# Patient Record
Sex: Female | Born: 1990 | Race: White | Hispanic: No | Marital: Married
Health system: Midwestern US, Community
[De-identification: ages and names within clinical notes are randomized; demographics above are authoritative.]

## PROBLEM LIST (undated history)

## (undated) DIAGNOSIS — R569 Unspecified convulsions: Secondary | ICD-10-CM

## (undated) DIAGNOSIS — R87619 Unspecified abnormal cytological findings in specimens from cervix uteri: Secondary | ICD-10-CM

## (undated) HISTORY — DX: Unspecified abnormal cytological findings in specimens from cervix uteri: R87.619

## (undated) HISTORY — PX: NO PAST SURGERIES: SHX2092

---

## 2014-07-03 NOTE — ED Provider Notes (Signed)
PATIENT:          Rhonda Hobbs, Rhonda Hobbs    DOS:           07/03/2014  MR #:             [id number]             ACCOUNT #:     [id number]  DATE OF BIRTH:    03-Mar-1991              AGE:           23      PROBLEM LIST:     R ankle pain: Entered Date: 03-Jul-2014 20:42, Entered By:  Carin Hock,  INTERFACES    HISTORY OF PRESENT ILLNESS:    PERTINENT HISTORY OF PRESENT  ILLNESS. 23 year old female stepped  off of a  ladder and accidentally rolled her right ankle.  Unable to walk on it  since  secondary to pain.      PHYSICAL EXAM Vital signs are stable she is in no acute distress.  Pain,  swelling, tenderness to the right lateral malleolus.  No deformities.  Joints  stable.  Nontender medial malleolus, fibular head.  She does have  tenderness  and pain at the base of the fifth metatarsal.  Neurovascularly intact  distally.   Limited range of motion of the ankle secondary to pain.    MEDICAL DECISION MAKING:    SIGNIFICANT FINDINGS/ED COURSE/MEDICAL DECISION MAKING/TREATMENT  PLAN X-rays  of the right ankle and foot show no acute fractures or acute bony  abnormalities.  She was placed in an Aircast, she has her own  crutches, she was  given naproxen, ice pack, and a prescription for naproxen and advised  to  followup in one to 2 weeks if she is not improving.  She was also  advised to  limit weightbearing for the first week as tolerated/able.      DIAGNOSIS Acute right ankle sprain    COPIES SENT TO::     GSELLMAN, ROBERT(PCP): [id number]    Electronic Signatures:  Levonne Hubert A (MD)  (Signed 03-Jul-2014 22:22)   Authored: PROBLEM LIST, HISTORY OF PRESENT ILLNESS, PHYSICAL EXAM,  MEDICAL  DECISION MAKING, DIAGNOSIS, Copies to be sent to:      Last Updated: 03-Jul-2014 22:22 by Tawni Pummel (MD)            Please see T-Sheet, initial assessment, and physician orders for  further details.    Dictating Physician: Tawni Pummel, MD  Original Electronic Signature Date: 07/03/2014 10:22 P  BAB  Document #:  [id number]    cc:  Era Skeen, MD       [phone]       [place] [address]

## 2014-07-04 ENCOUNTER — Inpatient Hospital Stay
Admit: 2014-07-04 | Discharge: 2014-07-04 | Disposition: A | Discharge status: Home / Self Care | Attending: Emergency Medicine

## 2015-12-09 NOTE — L&D Delivery Note (Signed)
Delivery Note At  a viable girl was delivered via  (Presentation:vertex ; LOA).  APGAR: 9, 9 ; weight pending .   Placenta status: intact, delivered with gentle traction .  Cord:  3 vessel with the following complications: none .  Cord pH: not colelcted  Anesthesia:  none Episiotomy:  none Lacerations:  1st degree right labial Est. Blood Loss (mL):  350  Mom to postpartum.  Baby to Couplet care / Skin to Skin.  Judith Fisher 11/08/2016, 9:42 PM

## 2016-06-25 ENCOUNTER — Ambulatory Visit (INDEPENDENT_AMBULATORY_CARE_PROVIDER_SITE_OTHER): Payer: Medicaid Other | Admitting: Obstetrics & Gynecology

## 2016-06-25 ENCOUNTER — Encounter: Payer: Self-pay | Admitting: Obstetrics & Gynecology

## 2016-06-25 VITALS — BP 95/63 | HR 86 | Temp 99.1°F | Wt 137.8 lb

## 2016-06-25 DIAGNOSIS — Z3492 Encounter for supervision of normal pregnancy, unspecified, second trimester: Secondary | ICD-10-CM | POA: Diagnosis not present

## 2016-06-25 DIAGNOSIS — O09899 Supervision of other high risk pregnancies, unspecified trimester: Secondary | ICD-10-CM

## 2016-06-25 DIAGNOSIS — Z302 Encounter for sterilization: Secondary | ICD-10-CM | POA: Insufficient documentation

## 2016-06-25 DIAGNOSIS — Z3490 Encounter for supervision of normal pregnancy, unspecified, unspecified trimester: Secondary | ICD-10-CM | POA: Insufficient documentation

## 2016-06-25 DIAGNOSIS — Z1389 Encounter for screening for other disorder: Secondary | ICD-10-CM

## 2016-06-25 DIAGNOSIS — Z331 Pregnant state, incidental: Secondary | ICD-10-CM

## 2016-06-25 LAB — POCT URINALYSIS DIPSTICK
Bilirubin, UA: NEGATIVE
Blood, UA: NEGATIVE
Ketones, UA: NEGATIVE
Nitrite, UA: NEGATIVE
Protein, UA: NEGATIVE
UROBILINOGEN UA: 0.2
pH, UA: 8

## 2016-06-25 NOTE — Patient Instructions (Signed)
Thank you for enrolling in MyChart. Please follow the instructions below to securely access your online medical record. MyChart allows you to send messages to your doctor, view your test results, renew your prescriptions, schedule appointments, and more.  How Do I Sign Up? 1. In your Internet browser, go to [email or web address] 2. Click on the New  User? link in the Sign In box.  3. Enter your MyChart Access Code exactly as it appears below. You will not need to use this code after you have completed the sign-up process. If you do not sign up before the expiration date, you must request a new code. MyChart Access Code: [email or web address] Expires: 08/16/2016  2:15 PM  4. Enter the last four digits of your Social Security Number (xxxx) and Date of Birth (mm/dd/yyyy) as indicated and click Next. You will be taken to the next sign-up page. 5. Create a MyChart ID. This will be your MyChart login ID and cannot be changed, so think of one that is secure and easy to remember. 6. Create a MyChart password. You can change your password at any time. 7. Enter your Password Reset Question and Answer and click Next. This can be used at a later time if you forget your password.  8. Select your communication preference, and if applicable enter your e-mail address. You will receive e-mail notification when new information is available in MyChart by choosing to receive e-mail notifications and filling in your e-mail. 9. Click Sign In. You can now view your medical record.   Additional Information If you have questions, you can email [email or web address] or call [phone] to talk to our MyChart staff. Remember, MyChart is NOT to be used for urgent needs. For medical emergencies, dial 911.  Second Trimester of Pregnancy The second trimester is from week 13 through week 28, months 4 through 6. The second trimester is often a time when you feel your best. Your body has also adjusted to  being pregnant, and you begin to feel better physically. Usually, morning sickness has lessened or quit completely, you may have more energy, and you may have an increase in appetite. The second trimester is also a time when the fetus is growing rapidly. At the end of the sixth month, the fetus is about 9 inches long and weighs about 1 pounds. You will likely begin to feel the baby move (quickening) between 18 and 20 weeks of the pregnancy. BODY CHANGES Your body goes through many changes during pregnancy. The changes vary from woman to woman.   Your weight will continue to increase. You will notice your lower abdomen bulging out.  You may begin to get stretch marks on your hips, abdomen, and breasts.  You may develop headaches that can be relieved by medicines approved by your health care provider.  You may urinate more often because the fetus is pressing on your bladder.  You may develop or continue to have heartburn as a result of your pregnancy.  You may develop constipation because certain hormones are causing the muscles that push waste through your intestines to slow down.  You may develop hemorrhoids or swollen, bulging veins (varicose veins).  You may have back pain because of the weight gain and pregnancy hormones relaxing your joints between the bones in your pelvis and as a result of a shift in weight and the muscles that support your balance.  Your breasts will continue to grow and be tender.  Your gums may bleed and  may be sensitive to brushing and flossing.  Dark spots or blotches (chloasma, mask of pregnancy) may develop on your face. This will likely fade after the baby is born.  A dark line from your belly button to the pubic area (linea nigra) may appear. This will likely fade after the baby is born.  You may have changes in your hair. These can include thickening of your hair, rapid growth, and changes in texture. Some women also have hair loss during or after  pregnancy, or hair that feels dry or thin. Your hair will most likely return to normal after your baby is born. WHAT TO EXPECT AT YOUR PRENATAL VISITS During a routine prenatal visit:  You will be weighed to make sure you and the fetus are growing normally.  Your blood pressure will be taken.  Your abdomen will be measured to track your baby's growth.  The fetal heartbeat will be listened to.  Any test results from the previous visit will be discussed. Your health care provider may ask you:  How you are feeling.  If you are feeling the baby move.  If you have had any abnormal symptoms, such as leaking fluid, bleeding, severe headaches, or abdominal cramping.  If you are using any tobacco products, including cigarettes, chewing tobacco, and electronic cigarettes.  If you have any questions. Other tests that may be performed during your second trimester include:  Blood tests that check for:  Low iron levels (anemia).  Gestational diabetes (between 24 and 28 weeks).  Rh antibodies.  Urine tests to check for infections, diabetes, or protein in the urine.  An ultrasound to confirm the proper growth and development of the baby.  An amniocentesis to check for possible genetic problems.  Fetal screens for spina bifida and Down syndrome.  HIV (human immunodeficiency virus) testing. Routine prenatal testing includes screening for HIV, unless you choose not to have this test. HOME CARE INSTRUCTIONS   Avoid all smoking, herbs, alcohol, and unprescribed drugs. These chemicals affect the formation and growth of the baby.  Do not use any tobacco products, including cigarettes, chewing tobacco, and electronic cigarettes. If you need help quitting, ask your health care provider. You may receive counseling support and other resources to help you quit.  Follow your health care provider's instructions regarding medicine use. There are medicines that are either safe or unsafe to take  during pregnancy.  Exercise only as directed by your health care provider. Experiencing uterine cramps is a good sign to stop exercising.  Continue to eat regular, healthy meals.  Wear a good support bra for breast tenderness.  Do not use hot tubs, steam rooms, or saunas.  Wear your seat belt at all times when driving.  Avoid raw meat, uncooked cheese, cat litter boxes, and soil used by cats. These carry germs that can cause birth defects in the baby.  Take your prenatal vitamins.  Take 1500-2000 mg of calcium daily starting at the 20th week of pregnancy until you deliver your baby.  Try taking a stool softener (if your health care provider approves) if you develop constipation. Eat more high-fiber foods, such as fresh vegetables or fruit and whole grains. Drink plenty of fluids to keep your urine clear or pale yellow.  Take warm sitz baths to soothe any pain or discomfort caused by hemorrhoids. Use hemorrhoid cream if your health care provider approves.  If you develop varicose veins, wear support hose. Elevate your feet for 15 minutes, 3-4 times a day. Limit  salt in your diet.  Avoid heavy lifting, wear low heel shoes, and practice good posture.  Rest with your legs elevated if you have leg cramps or low back pain.  Visit your dentist if you have not gone yet during your pregnancy. Use a soft toothbrush to brush your teeth and be gentle when you floss.  A sexual relationship may be continued unless your health care provider directs you otherwise.  Continue to go to all your prenatal visits as directed by your health care provider. SEEK MEDICAL CARE IF:   You have dizziness.  You have mild pelvic cramps, pelvic pressure, or nagging pain in the abdominal area.  You have persistent nausea, vomiting, or diarrhea.  You have a bad smelling vaginal discharge.  You have pain with urination. SEEK IMMEDIATE MEDICAL CARE IF:   You have a fever.  You are leaking fluid from your  vagina.  You have spotting or bleeding from your vagina.  You have severe abdominal cramping or pain.  You have rapid weight gain or loss.  You have shortness of breath with chest pain.  You notice sudden or extreme swelling of your face, hands, ankles, feet, or legs.  You have not felt your baby move in over an hour.  You have severe headaches that do not go away with medicine.  You have vision changes.   This information is not intended to replace advice given to you by your health care provider. Make sure you discuss any questions you have with your health care provider.   Document Released: 11/18/2001 Document Revised: 12/15/2014 Document Reviewed: 01/25/2013 Elsevier Interactive Patient Education [employer]

## 2016-06-25 NOTE — Progress Notes (Signed)
Subjective:  Judith Fisher is a 25 y.o. G3P1002 at 1326w4d being seen today for transfer of prenatal care from [place].  She had a few visits there, had early ultrasounds, labs but no pap smear.  She is currently monitored for the following issues for this low-risk pregnancy and has Supervision of normal pregnancy; Request for sterilization; and Short interval between pregnancies affecting pregnancy, antepartum on her problem list.  Patient reports occasional pelvic pressure.  Contractions: Not present. Vag. Bleeding: None.  Movement: Present. Denies leaking of fluid.   Desires sterilization.  The following portions of the patient's history were reviewed and updated as appropriate: allergies, current medications, past family history, past medical history, past social history, past surgical history and problem list. Problem list updated.  Objective:   Filed Vitals:   06/25/16 1042  BP: 95/63  Pulse: 86  Temp: 99.1 F (37.3 C)  Weight: 137 lb 12.8 oz (62.506 kg)    Fetal Status:     Movement: Present     General:  Alert, oriented and cooperative. Patient is in no acute distress.  Skin: Skin is warm and dry. No rash noted.   Cardiovascular: Normal heart rate noted  Respiratory: Normal respiratory effort, no problems with respiration noted  Abdomen: Soft, gravid, appropriate for gestational age. Pain/Pressure: Present     Pelvic:  Cervical exam performed Dilation: Closed Effacement (%): Thick Station: Ballotable  Extremities: Normal range of motion.  Edema: None  Mental Status: Normal mood and affect. Normal behavior. Normal judgment and thought content.   Urinalysis: Urine Protein: Negative Urine Glucose: Negative  Assessment and Plan:  Pregnancy: G3P1002 at 4826w4d  1. Request for sterilization Will sign papers at 28 weeks  2. Short interval between pregnancies affecting pregnancy, antepartum Last SVD at 11/2015.  3. Supervision of normal pregnancy, second trimester - POCT  Urinalysis Dipstick - Pap IG w/ reflex to HPV when ASC-U - GC/Chlamydia Probe Amp - US OB Comp + 14 Wk; Future - ToxASSURE Select 13 (MW), Urine Patient was offered quad screen, she declined. Will follow up anatomy scan. Will await records from [place] as pertaining to her prenatal labs. Routine obstetric precautions reviewed. The nature of [facility] with multiple MDs and other Advanced Practice Providers was explained to patient; also emphasized that residents, students are part of our team. Please refer to After Visit Summary for other counseling recommendations.  Return in about 4 weeks (around 07/23/2016) for OB Visit.   Tereso NewcomerUgonna A Franko Hilliker, MD

## 2016-06-27 LAB — PAP IG W/ RFLX HPV ASCU: PAP Smear Comment: 0

## 2016-06-27 LAB — GC/CHLAMYDIA PROBE AMP
CHLAMYDIA, DNA PROBE: NEGATIVE
NEISSERIA GONORRHOEAE BY PCR: NEGATIVE

## 2016-07-01 ENCOUNTER — Ambulatory Visit (INDEPENDENT_AMBULATORY_CARE_PROVIDER_SITE_OTHER): Payer: Medicaid Other

## 2016-07-01 DIAGNOSIS — Z3492 Encounter for supervision of normal pregnancy, unspecified, second trimester: Secondary | ICD-10-CM

## 2016-07-01 DIAGNOSIS — Z36 Encounter for antenatal screening of mother: Secondary | ICD-10-CM | POA: Diagnosis not present

## 2016-07-02 ENCOUNTER — Encounter: Payer: Self-pay | Admitting: Obstetrics and Gynecology

## 2016-07-03 LAB — TOXASSURE SELECT 13 (MW), URINE: PDF: 0

## 2016-07-23 ENCOUNTER — Ambulatory Visit (INDEPENDENT_AMBULATORY_CARE_PROVIDER_SITE_OTHER): Payer: Medicaid Other | Admitting: Obstetrics and Gynecology

## 2016-07-23 VITALS — BP 101/67 | HR 86 | Temp 98.8°F | Wt 141.3 lb

## 2016-07-23 DIAGNOSIS — Z1389 Encounter for screening for other disorder: Secondary | ICD-10-CM

## 2016-07-23 DIAGNOSIS — Z302 Encounter for sterilization: Secondary | ICD-10-CM

## 2016-07-23 DIAGNOSIS — O09899 Supervision of other high risk pregnancies, unspecified trimester: Secondary | ICD-10-CM

## 2016-07-23 DIAGNOSIS — Z3492 Encounter for supervision of normal pregnancy, unspecified, second trimester: Secondary | ICD-10-CM

## 2016-07-23 DIAGNOSIS — Z331 Pregnant state, incidental: Secondary | ICD-10-CM | POA: Diagnosis not present

## 2016-07-23 LAB — POCT URINALYSIS DIPSTICK
Bilirubin, UA: NEGATIVE
Glucose, UA: NEGATIVE
Ketones, UA: NEGATIVE
LEUKOCYTES UA: NEGATIVE
NITRITE UA: NEGATIVE
PROTEIN UA: NEGATIVE
RBC UA: NEGATIVE
Spec Grav, UA: <=1.005
UROBILINOGEN UA: 0.2

## 2016-07-23 NOTE — Progress Notes (Signed)
Pt c/o extreme back pain (h/o pinched nerve).

## 2016-07-23 NOTE — Progress Notes (Signed)
Subjective:  Judith Fisher is a 25 y.o. G3P1002 at 2262w4d being seen today for ongoing prenatal care.  She is currently monitored for the following issues for this low-risk pregnancy and has Supervision of normal pregnancy; Request for sterilization; and Short interval between pregnancies affecting pregnancy, antepartum on her problem list.  Patient reports no complaints.  Contractions: Not present. Vag. Bleeding: None.  Movement: Present. Denies leaking of fluid.   The following portions of the patient's history were reviewed and updated as appropriate: allergies, current medications, past family history, past medical history, past social history, past surgical history and problem list. Problem list updated.  Objective:   Vitals:   07/23/16 1015  BP: 101/67  Pulse: 86  Temp: 98.8 F (37.1 C)  Weight: 141 lb 4.8 oz (64.1 kg)    Fetal Status: Fetal Heart Rate (bpm): 141 Fundal Height: 23 cm Movement: Present     General:  Alert, oriented and cooperative. Patient is in no acute distress.  Skin: Skin is warm and dry. No rash noted.   Cardiovascular: Normal heart rate noted  Respiratory: Normal respiratory effort, no problems with respiration noted  Abdomen: Soft, gravid, appropriate for gestational age. Pain/Pressure: Present     Pelvic:  Cervical exam deferred        Extremities: Normal range of motion.  Edema: None  Mental Status: Normal mood and affect. Normal behavior. Normal judgment and thought content.   Urinalysis: Urine Protein: Negative Urine Glucose: Negative  Assessment and Plan:  Pregnancy: G3P1002 at 5962w4d  1. Prenatal care, second trimester  - POCT Urinalysis Dipstick  2. Supervision of normal pregnancy, second trimester Patient is doing well without complaints I requested ultrasound report for review and will discuss results with the patient at next visit Prenatal records from [place] still unavailable- second request made today  3. Short interval between  pregnancies affecting pregnancy, antepartum   4. Request for sterilization Will sign consent at 28 weeks  General obstetric precautions including but not limited to vaginal bleeding, contractions, leaking of fluid and fetal movement were reviewed in detail with the patient. Please refer to After Visit Summary for other counseling recommendations.  Return in about 4 weeks (around 08/20/2016) for ROB/ glucola.   Catalina AntiguaPeggy Orlanda Frankum, MD

## 2016-08-20 ENCOUNTER — Encounter: Payer: Medicaid Other | Admitting: Obstetrics and Gynecology

## 2016-08-20 ENCOUNTER — Other Ambulatory Visit: Payer: Medicaid Other

## 2016-08-26 ENCOUNTER — Other Ambulatory Visit: Payer: Medicaid Other

## 2016-08-27 ENCOUNTER — Encounter: Payer: Self-pay | Admitting: Obstetrics & Gynecology

## 2016-08-27 ENCOUNTER — Ambulatory Visit (INDEPENDENT_AMBULATORY_CARE_PROVIDER_SITE_OTHER): Payer: Medicaid Other | Admitting: Obstetrics & Gynecology

## 2016-08-27 ENCOUNTER — Other Ambulatory Visit: Payer: Medicaid Other

## 2016-08-27 VITALS — BP 95/64 | HR 97 | Temp 98.7°F | Wt 148.2 lb

## 2016-08-27 DIAGNOSIS — J069 Acute upper respiratory infection, unspecified: Secondary | ICD-10-CM | POA: Diagnosis not present

## 2016-08-27 DIAGNOSIS — Z3493 Encounter for supervision of normal pregnancy, unspecified, third trimester: Secondary | ICD-10-CM

## 2016-08-27 MED ORDER — AZITHROMYCIN 250 MG PO TABS: ORAL_TABLET | ORAL | 1 refills | Status: DC

## 2016-08-27 NOTE — Patient Instructions (Addendum)
Return to clinic for any scheduled appointments or obstetric concerns, or go to [facility] for evaluation  

## 2016-08-27 NOTE — Progress Notes (Signed)
Patient stated that she has had a fever on and off for the last 3 weeks, along with stuffy nose, and a cough. Temp today was 98.7, patient stated that she has felt good fetal movement.

## 2016-08-27 NOTE — Progress Notes (Signed)
   PRENATAL VISIT NOTE  Subjective:  Judith Fisher is a 25 y.o. G3P1002 at 4560w4d being seen today for ongoing prenatal care.  She is currently monitored for the following issues for this low-risk pregnancy and has Supervision of normal pregnancy; Request for sterilization; and Short interval between pregnancies affecting pregnancy, antepartum on her problem list.  Patient reports protracted URI symptoms for three weeks, not helped with OTC meds. Has productive intermittent cough and low-grade fevers..  Contractions: Not present. Vag. Bleeding: None.  Movement: Present. Denies leaking of fluid.   The following portions of the patient's history were reviewed and updated as appropriate: allergies, current medications, past family history, past medical history, past social history, past surgical history and problem list. Problem list updated.  Objective:   Vitals:   08/27/16 1322  BP: 95/64  Pulse: 97  Temp: 98.7 F (37.1 C)  Weight: 148 lb 3.2 oz (67.2 kg)    Fetal Status: Fetal Heart Rate (bpm): 145 Fundal Height: 28 cm Movement: Present     General:  Alert, oriented and cooperative. Patient is in no acute distress.  Skin: Skin is warm and dry. No rash noted.   Cardiovascular: Normal heart rate noted  Respiratory: Normal respiratory effort, no problems with respiration noted  Abdomen: Soft, gravid, appropriate for gestational age. Pain/Pressure: Absent     Pelvic:  Cervical exam deferred        Extremities: Normal range of motion.  Edema: None  Mental Status: Normal mood and affect. Normal behavior. Normal judgment and thought content.   Urinalysis: Urine Protein: Negative Urine Glucose: Negative  Assessment and Plan:  Pregnancy: G3P1002 at 3460w4d  1. Protracted URI Z pack prescribed. Afebrile today. - azithromycin (ZITHROMAX) 250 MG tablet; Take as directed: Two pills by mouth the first day, then one pill every day until completed  Dispense: 6 tablet; Refill: 1  2.  Supervision of normal pregnancy, third trimester Third trimester labs done. Declined Tdap/Flu vaccines. - Glucose Tolerance, 2 Hours w/1 Hour - RPR - HIV antibody - CBC Preterm labor symptoms and general obstetric precautions including but not limited to vaginal bleeding, contractions, leaking of fluid and fetal movement were reviewed in detail with the patient. Please refer to After Visit Summary for other counseling recommendations.  Return in about 2 weeks (around 09/10/2016) for OB Visit.  Tereso NewcomerUgonna A Vernadine Coombs, MD

## 2016-08-28 LAB — CBC
HEMATOCRIT: 36.2 % (ref 34.0–46.6)
HEMOGLOBIN: 12.4 g/dL (ref 11.1–15.9)
MCH: 30.8 pg (ref 26.6–33.0)
MCHC: 34.3 g/dL (ref 31.5–35.7)
MCV: 90 fL (ref 79–97)
Platelets: 287 10*3/uL (ref 150–379)
RBC: 4.03 x10E6/uL (ref 3.77–5.28)
RDW: 13.3 % (ref 12.3–15.4)
WBC: 14.6 10*3/uL — ABNORMAL HIGH (ref 3.4–10.8)

## 2016-08-28 LAB — GLUCOSE TOLERANCE, 2 HOURS W/ 1HR
GLUCOSE, 1 HOUR: 114 mg/dL (ref 65–179)
GLUCOSE, FASTING: 69 mg/dL (ref 65–91)
Glucose, 2 hour: 109 mg/dL (ref 65–152)

## 2016-08-28 LAB — HIV ANTIBODY (ROUTINE TESTING W REFLEX): HIV Screen 4th Generation wRfx: NONREACTIVE

## 2016-08-28 LAB — RPR: RPR Ser Ql: NONREACTIVE

## 2016-09-10 ENCOUNTER — Encounter: Payer: Self-pay | Admitting: Obstetrics & Gynecology

## 2016-09-18 ENCOUNTER — Encounter: Payer: Self-pay | Admitting: *Deleted

## 2016-09-18 ENCOUNTER — Ambulatory Visit (INDEPENDENT_AMBULATORY_CARE_PROVIDER_SITE_OTHER): Payer: Medicaid Other | Admitting: Obstetrics and Gynecology

## 2016-09-18 VITALS — BP 105/67 | HR 91 | Ht 63.0 in | Wt 147.0 lb

## 2016-09-18 DIAGNOSIS — O0933 Supervision of pregnancy with insufficient antenatal care, third trimester: Secondary | ICD-10-CM

## 2016-09-18 DIAGNOSIS — Z3483 Encounter for supervision of other normal pregnancy, third trimester: Secondary | ICD-10-CM | POA: Diagnosis not present

## 2016-09-18 DIAGNOSIS — Z3493 Encounter for supervision of normal pregnancy, unspecified, third trimester: Secondary | ICD-10-CM

## 2016-09-18 NOTE — Progress Notes (Signed)
Prenatal Visit Note Date: 09/18/2016 Clinic: [facility]  Subjective:  Judith Fisher is a 25 y.o. G3P2002 at 540w5d being seen today for ongoing prenatal care.  She is currently monitored for the following issues for this low-risk pregnancy and has Supervision of normal pregnancy; Request for sterilization; and Short interval between pregnancies affecting pregnancy, antepartum on her problem list.  Patient reports no complaints.   Contractions: Not present. Vag. Bleeding: None.  Movement: Present. Denies leaking of fluid.   The following portions of the patient's history were reviewed and updated as appropriate: allergies, current medications, past family history, past medical history, past social history, past surgical history and problem list. Problem list updated.  Objective:   Vitals:   09/18/16 1601 09/18/16 1603  BP: 105/67   Pulse: 91   Weight: 147 lb (66.7 kg)   Height:  5\' 3"  (1.6 m)    Fetal Status: Fetal Heart Rate (bpm): 140s Fundal Height: 31 cm Movement: Present     General:  Alert, oriented and cooperative. Patient is in no acute distress.  Skin: Skin is warm and dry. No rash noted.   Cardiovascular: Normal heart rate noted  Respiratory: Normal respiratory effort, no problems with respiration noted  Abdomen: Soft, gravid, appropriate for gestational age. Pain/Pressure: Present     Pelvic:  Cervical exam deferred        Extremities: Normal range of motion.  Edema: None  Mental Status: Normal mood and affect. Normal behavior. Normal judgment and thought content.   Urinalysis:      Assessment and Plan:  Pregnancy: G3P2002 at 6540w5d  1. Encounter for supervision of normal pregnancy in third trimester, unspecified gravidity Routine care. BTL papers signed today.   Preterm labor symptoms and general obstetric precautions including but not limited to vaginal bleeding, contractions, leaking of fluid and fetal movement were reviewed in detail with the patient. Please  refer to After Visit Summary for other counseling recommendations.  RTC 2-3wks   [place] Bingharlie Maelee Hoot, MD

## 2016-10-08 ENCOUNTER — Encounter: Payer: Medicaid Other | Admitting: Obstetrics and Gynecology

## 2016-11-08 ENCOUNTER — Encounter (HOSPITAL_COMMUNITY): Payer: Self-pay | Admitting: *Deleted

## 2016-11-08 ENCOUNTER — Inpatient Hospital Stay (HOSPITAL_COMMUNITY)
Admission: AD | Admit: 2016-11-08 | Discharge: 2016-11-10 | DRG: 767 | Disposition: A | Discharge status: Home / Self Care | Payer: Medicaid Other | Source: Ambulatory Visit | Attending: Family Medicine | Admitting: Family Medicine

## 2016-11-08 DIAGNOSIS — O0930 Supervision of pregnancy with insufficient antenatal care, unspecified trimester: Secondary | ICD-10-CM

## 2016-11-08 DIAGNOSIS — F17210 Nicotine dependence, cigarettes, uncomplicated: Secondary | ICD-10-CM | POA: Diagnosis present

## 2016-11-08 DIAGNOSIS — Z3493 Encounter for supervision of normal pregnancy, unspecified, third trimester: Secondary | ICD-10-CM

## 2016-11-08 DIAGNOSIS — O99334 Smoking (tobacco) complicating childbirth: Secondary | ICD-10-CM | POA: Diagnosis present

## 2016-11-08 DIAGNOSIS — Z302 Encounter for sterilization: Secondary | ICD-10-CM | POA: Diagnosis not present

## 2016-11-08 DIAGNOSIS — Z3A39 39 weeks gestation of pregnancy: Secondary | ICD-10-CM

## 2016-11-08 DIAGNOSIS — O09899 Supervision of other high risk pregnancies, unspecified trimester: Secondary | ICD-10-CM

## 2016-11-08 LAB — CBC
HEMATOCRIT: 39.9 % (ref 36.0–46.0)
HEMOGLOBIN: 13.5 g/dL (ref 12.0–15.0)
MCH: 29.2 pg (ref 26.0–34.0)
MCHC: 33.8 g/dL (ref 30.0–36.0)
MCV: 86.4 fL (ref 78.0–100.0)
Platelets: 257 10*3/uL (ref 150–400)
RBC: 4.62 MIL/uL (ref 3.87–5.11)
RDW: 13.2 % (ref 11.5–15.5)
WBC: 19.2 10*3/uL — ABNORMAL HIGH (ref 4.0–10.5)

## 2016-11-08 LAB — TYPE AND SCREEN
ABO/RH(D): O POS
Antibody Screen: NEGATIVE

## 2016-11-08 LAB — ABO/RH: ABO/RH(D): O POS

## 2016-11-08 MED ORDER — EPHEDRINE 5 MG/ML INJ
10.0000 mg | INTRAVENOUS | Status: DC | PRN
Start: 2016-11-08 — End: 2016-11-09
  Filled 2016-11-08: qty 4

## 2016-11-08 MED ORDER — OXYCODONE-ACETAMINOPHEN 5-325 MG PO TABS: 2.0000 | ORAL_TABLET | ORAL | Status: DC | PRN

## 2016-11-08 MED ORDER — METHYLERGONOVINE MALEATE 0.2 MG PO TABS: 0.2000 mg | ORAL_TABLET | ORAL | Status: DC | PRN

## 2016-11-08 MED ORDER — MISOPROSTOL 200 MCG PO TABS
ORAL_TABLET | ORAL | Status: AC
  Filled 2016-11-08: qty 5

## 2016-11-08 MED ORDER — METHYLERGONOVINE MALEATE 0.2 MG/ML IJ SOLN
0.2000 mg | INTRAMUSCULAR | Status: DC | PRN
  Administered 2016-11-08: 0.2 mg via INTRAMUSCULAR
  Filled 2016-11-08: qty 1

## 2016-11-08 MED ORDER — IBUPROFEN 600 MG PO TABS
600.0000 mg | ORAL_TABLET | Freq: Four times a day (QID) | ORAL | Status: DC
  Administered 2016-11-08 – 2016-11-10 (×6): 600 mg via ORAL
  Filled 2016-11-08 (×6): qty 1

## 2016-11-08 MED ORDER — LACTATED RINGERS IV SOLN
INTRAVENOUS | Status: DC
  Administered 2016-11-08: 16:00:00 via INTRAVENOUS

## 2016-11-08 MED ORDER — PHENYLEPHRINE 40 MCG/ML (10ML) SYRINGE FOR IV PUSH (FOR BLOOD PRESSURE SUPPORT)
80.0000 ug | PREFILLED_SYRINGE | INTRAVENOUS | Status: DC | PRN
  Filled 2016-11-08: qty 5

## 2016-11-08 MED ORDER — LACTATED RINGERS IV SOLN
500.0000 mL | INTRAVENOUS | Status: DC | PRN
Start: 2016-11-08 — End: 2016-11-09

## 2016-11-08 MED ORDER — DIPHENHYDRAMINE HCL 50 MG/ML IJ SOLN: 12.5000 mg | INTRAMUSCULAR | Status: DC | PRN

## 2016-11-08 MED ORDER — OXYCODONE-ACETAMINOPHEN 5-325 MG PO TABS: 1.0000 | ORAL_TABLET | ORAL | Status: DC | PRN

## 2016-11-08 MED ORDER — LIDOCAINE HCL (PF) 1 % IJ SOLN
30.0000 mL | INTRAMUSCULAR | Status: DC | PRN
  Filled 2016-11-08: qty 30

## 2016-11-08 MED ORDER — ACETAMINOPHEN 325 MG PO TABS: 650.0000 mg | ORAL_TABLET | ORAL | Status: DC | PRN

## 2016-11-08 MED ORDER — FENTANYL 2.5 MCG/ML BUPIVACAINE 1/10 % EPIDURAL INFUSION (WH - ANES): 14.0000 mL/h | INTRAMUSCULAR | Status: DC | PRN

## 2016-11-08 MED ORDER — ONDANSETRON HCL 4 MG/2ML IJ SOLN: 4.0000 mg | Freq: Four times a day (QID) | INTRAMUSCULAR | Status: DC | PRN

## 2016-11-08 MED ORDER — OXYTOCIN 40 UNITS IN LACTATED RINGERS INFUSION - SIMPLE MED
2.5000 [IU]/h | INTRAVENOUS | Status: DC
  Filled 2016-11-08: qty 1000

## 2016-11-08 MED ORDER — SODIUM CHLORIDE 0.9 % IV SOLN
2.0000 g | Freq: Once | INTRAVENOUS | Status: AC
  Administered 2016-11-08: 2 g via INTRAVENOUS
  Filled 2016-11-08: qty 2000

## 2016-11-08 MED ORDER — EPHEDRINE 5 MG/ML INJ
10.0000 mg | INTRAVENOUS | Status: DC | PRN
  Filled 2016-11-08: qty 4

## 2016-11-08 MED ORDER — LACTATED RINGERS IV SOLN: 500.0000 mL | Freq: Once | INTRAVENOUS | Status: DC

## 2016-11-08 MED ORDER — OXYTOCIN BOLUS FROM INFUSION: 500.0000 mL | Freq: Once | INTRAVENOUS | Status: DC

## 2016-11-08 MED ORDER — SOD CITRATE-CITRIC ACID 500-334 MG/5ML PO SOLN: 30.0000 mL | ORAL | Status: DC | PRN

## 2016-11-08 MED ORDER — FENTANYL CITRATE (PF) 100 MCG/2ML IJ SOLN
100.0000 ug | INTRAMUSCULAR | Status: DC | PRN
Start: 2016-11-08 — End: 2016-11-09
  Administered 2016-11-08: 100 ug via INTRAVENOUS
  Filled 2016-11-08 (×2): qty 2

## 2016-11-08 MED ORDER — FLEET ENEMA 7-19 GM/118ML RE ENEM: 1.0000 | ENEMA | RECTAL | Status: DC | PRN

## 2016-11-08 MED ORDER — MISOPROSTOL 200 MCG PO TABS
1000.0000 ug | ORAL_TABLET | Freq: Once | ORAL | Status: AC
  Administered 2016-11-08: 1000 ug via RECTAL

## 2016-11-08 MED ORDER — FENTANYL CITRATE (PF) 100 MCG/2ML IJ SOLN
INTRAMUSCULAR | Status: AC
  Administered 2016-11-08: 100 ug
  Filled 2016-11-08: qty 2

## 2016-11-08 NOTE — Progress Notes (Signed)
Patient ID: Judith Fisher, female   DOB: May 12, 1991, 25 y.o.   MRN: [id number] Declines epidural  May want something IV for pain  Vitals:   11/08/16 1239 11/08/16 1438 11/08/16 1607 11/08/16 1647  BP: 101/61  98/62 103/77  Pulse: 86  69 96  Resp: 18  20 18   Temp: 98.6 F (37 C)     TempSrc: Oral     Weight: 149 lb 3.2 oz (67.7 kg) 149 lb (67.6 kg)    Height: 5\' 3"  (1.6 m) 5\' 3"  (1.6 m)      FHR reactive, Category I UCs every 3 min  Dilation: 6 Effacement (%): 80 Cervical Position: Middle Station: -1 Presentation: Vertex Exam by:: Mayford KnifeWilliams  Discussed option of AROM augmentation, Pitocin augmentation or expectant management  Wants to wait expectantly . Worried about "going to fast".

## 2016-11-08 NOTE — Progress Notes (Signed)
Patient with 350 cc blood loss at delivery. of cytotec given rectally immediately after delivery. Patient with continued passage of clot with fundal massage. Methergine series started. 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 Vitals stable. Check CBC in AM.

## 2016-11-08 NOTE — Anesthesia Pain Management Evaluation Note (Signed)
  CRNA Pain Management Visit Note  Patient: Judith DriverKrystel M Tassin, 25 y.o., female  "Hello I am a member of the anesthesia team at [facility]. We have an anesthesia team available at all times to provide care throughout the hospital, including epidural management and anesthesia for C-section. I don't know your plan for the delivery whether it a natural birth, water birth, IV sedation, nitrous supplementation, doula or epidural, but we want to meet your pain goals."   1.Was your pain managed to your expectations on prior hospitalizations?   Yes   2.What is your expectation for pain management during this hospitalization?     IV pain meds and Nitrous Oxide  3.How can we help you reach that goal? Continued intervention  Record the patient's initial score and the patient's pain goal.   Pain: 7/10  Pain Goal: less than 4 [facility] wants you to be able to say your pain was always managed very well.  [name], [name] 11/08/2016

## 2016-11-08 NOTE — H&P (Signed)
Judith DriverKrystel M Fisher is a [age] y.o. female presenting for uterine contractions Has not been to prenatal visits since 33 weeks.  Transfer[place] at 19 weeks . OB History    Gravida Para Term Preterm AB Living   3 2 2  0 0 2   SAB TAB Ectopic Multiple Live Births   0 0 0 0 2     Past Medical History:  Diagnosis Date  . Abnormal Pap smear of cervix    Several years ago   Past Surgical History:  Procedure Laterality Date  . NO PAST SURGERIES     Family History: family history includes Cancer in her maternal grandmother; Mental illness in her brother. Social History:  reports that she has been smoking Cigarettes.  She has been smoking about 0.50 packs per day. She has never used smokeless tobacco. She reports that she does not drink alcohol or use drugs.     Maternal Diabetes: No Genetic Screening: Normal Maternal Ultrasounds/Referrals: Normal Fetal Ultrasounds or other Referrals:  None Maternal Substance Abuse:  No Significant Maternal Medications:  None Significant Maternal Lab Results:  Lab values include: Group B Strep negative Other Comments:  None  Review of Systems  Constitutional: Negative for chills, fever and malaise/fatigue.  Respiratory: Negative for shortness of breath.   Gastrointestinal: Positive for abdominal pain. Negative for constipation, diarrhea, nausea and vomiting.   Maternal Medical History:  Reason for admission: Contractions.  Nausea.  Contractions: Onset was 1-2 hours ago.   Frequency: regular.   Perceived severity is moderate.    Fetal activity: Perceived fetal activity is normal.   Last perceived fetal movement was within the past hour.    Prenatal complications: No bleeding, PIH, placental abnormality, pre-eclampsia or preterm labor.   Prenatal Complications - Diabetes: none.    Dilation: 6 Effacement (%): 80 Station: -2 Exam by:: Vira BlancoShimica Robinson RN  Blood pressure 101/61, pulse 86, temperature 98.6 F (37 C), temperature source  Oral, resp. rate 18, height 5\' 3"  (1.6 m), weight 149 lb (67.6 kg), last menstrual period 02/09/2016. Maternal Exam:  Uterine Assessment: Contraction strength is firm.  Contraction frequency is regular.   Abdomen: Patient reports no abdominal tenderness. Fundal height is 38.   Estimated fetal weight is 7.   Fetal presentation: vertex  Introitus: Normal vulva. Normal vagina.  Pelvis: adequate for delivery.   Cervix: Cervix evaluated by digital exam.     Fetal Exam Fetal Monitor Review: Mode: ultrasound.   Baseline rate: 140.  Variability: moderate (6-25 bpm).   Pattern: accelerations present and no decelerations.    Fetal State Assessment: Category I - tracings are normal.     Physical Exam  Constitutional: She is oriented to person, place, and time. She appears well-developed and well-nourished. No distress.  HENT:  Head: Normocephalic.  Cardiovascular: Normal rate and regular rhythm.   Respiratory: Effort normal and breath sounds normal.  GI: She exhibits no distension. There is no tenderness. There is no rebound and no guarding.  Musculoskeletal: Normal range of motion.  Neurological: She is alert and oriented to person, place, and time.  Skin: Skin is warm and dry.  Psychiatric: She has a normal mood and affect.    Prenatal labs: ABO, Rh:   Antibody:   Rubella:   RPR: Non Reactive (09/20 1100)  HBsAg:    HIV: Non Reactive (09/20 1100)  GBS:     Assessment/Plan: [facility] at 1456w0d Active Labor Insufficient prenatal care  [facility] orders  [facility]ILLI[facility],[place] Foot 11/08/2016, 3:27 PM

## 2016-11-08 NOTE — Progress Notes (Signed)
Chriss DriverKrystel M Solan is a 25 y.o. G3P2002 at 3082w0d in labor  Subjective:   Objective: BP 105/67   Pulse 81   Temp 97.9 F (36.6 C) (Oral)   Resp 20   Ht 5\' 3"  (1.6 m)   Wt 149 lb (67.6 kg)   LMP 02/09/2016 (Exact Date)   BMI 26.39 kg/m  No intake/output data recorded. No intake/output data recorded.  FHT:  FHR: 130 bpm, variability: moderate,  accelerations:  Present,  decelerations:  Absent UC:   irregular SVE:   7/80/-1 Labs: Lab Results  Component Value Date   WBC 19.2 (H) 11/08/2016   HGB 13.5 11/08/2016   HCT 39.9 11/08/2016   MCV 86.4 11/08/2016   PLT 257 11/08/2016    Assessment / Plan: AROM to accelerate labor.  Pitocin prn.   Nhan Qualley H. 11/08/2016, 7:07 PM

## 2016-11-08 NOTE — MAU Note (Signed)
Have not been to doctors since 33 weeks/ c/o contractions for past 2 days with increased d/c.  Lost plug a few weeks ago. Denies vaginal bleeding

## 2016-11-09 ENCOUNTER — Inpatient Hospital Stay (HOSPITAL_COMMUNITY): Payer: Medicaid Other | Admitting: Anesthesiology

## 2016-11-09 ENCOUNTER — Encounter (HOSPITAL_COMMUNITY)
Admission: AD | Disposition: A | Discharge status: Home / Self Care | Payer: Self-pay | Source: Ambulatory Visit | Attending: Family Medicine

## 2016-11-09 HISTORY — PX: TUBAL LIGATION: SHX77

## 2016-11-09 LAB — CBC
HCT: 35 % — ABNORMAL LOW (ref 36.0–46.0)
HEMOGLOBIN: 11.7 g/dL — AB (ref 12.0–15.0)
MCH: 28.7 pg (ref 26.0–34.0)
MCHC: 33.4 g/dL (ref 30.0–36.0)
MCV: 86 fL (ref 78.0–100.0)
Platelets: 260 10*3/uL (ref 150–400)
RBC: 4.07 MIL/uL (ref 3.87–5.11)
RDW: 13.1 % (ref 11.5–15.5)
WBC: 20.5 10*3/uL — ABNORMAL HIGH (ref 4.0–10.5)

## 2016-11-09 LAB — RPR: RPR Ser Ql: NONREACTIVE

## 2016-11-09 SURGERY — LIGATION, FALLOPIAN TUBE, POSTPARTUM
Anesthesia: General | Site: Abdomen | Laterality: Bilateral

## 2016-11-09 MED ORDER — SCOPOLAMINE 1 MG/3DAYS TD PT72
1.0000 | MEDICATED_PATCH | Freq: Once | TRANSDERMAL | Status: DC
  Filled 2016-11-09: qty 1

## 2016-11-09 MED ORDER — NALBUPHINE HCL 10 MG/ML IJ SOLN: 5.0000 mg | Freq: Once | INTRAMUSCULAR | Status: DC | PRN

## 2016-11-09 MED ORDER — LIDOCAINE HCL (CARDIAC) 20 MG/ML IV SOLN
INTRAVENOUS | Status: AC
  Filled 2016-11-09: qty 5

## 2016-11-09 MED ORDER — DIBUCAINE 1 % RE OINT: 1.0000 "application " | TOPICAL_OINTMENT | RECTAL | Status: DC | PRN

## 2016-11-09 MED ORDER — NALOXONE HCL 2 MG/2ML IJ SOSY
1.0000 ug/kg/h | PREFILLED_SYRINGE | INTRAMUSCULAR | Status: DC | PRN
  Filled 2016-11-09: qty 2

## 2016-11-09 MED ORDER — SUCCINYLCHOLINE CHLORIDE 200 MG/10ML IV SOSY
PREFILLED_SYRINGE | INTRAVENOUS | Status: AC
  Filled 2016-11-09: qty 10

## 2016-11-09 MED ORDER — GLYCOPYRROLATE 0.2 MG/ML IJ SOLN
INTRAMUSCULAR | Status: DC | PRN
  Administered 2016-11-09: 0.2 mg via INTRAVENOUS

## 2016-11-09 MED ORDER — ZOLPIDEM TARTRATE 5 MG PO TABS: 5.0000 mg | ORAL_TABLET | Freq: Every evening | ORAL | Status: DC | PRN

## 2016-11-09 MED ORDER — KETOROLAC TROMETHAMINE 30 MG/ML IJ SOLN: 30.0000 mg | Freq: Four times a day (QID) | INTRAMUSCULAR | Status: AC | PRN

## 2016-11-09 MED ORDER — LACTATED RINGERS IV SOLN
INTRAVENOUS | Status: DC
  Administered 2016-11-09 (×2): via INTRAVENOUS

## 2016-11-09 MED ORDER — FENTANYL CITRATE (PF) 250 MCG/5ML IJ SOLN
INTRAMUSCULAR | Status: AC
  Filled 2016-11-09: qty 5

## 2016-11-09 MED ORDER — PROPOFOL 10 MG/ML IV BOLUS
INTRAVENOUS | Status: DC | PRN
  Administered 2016-11-09: 150 mg via INTRAVENOUS
  Administered 2016-11-09: 40 mg via INTRAVENOUS

## 2016-11-09 MED ORDER — PRENATAL MULTIVITAMIN CH
1.0000 | ORAL_TABLET | Freq: Every day | ORAL | Status: DC
  Filled 2016-11-09: qty 1

## 2016-11-09 MED ORDER — METOCLOPRAMIDE HCL 10 MG PO TABS
10.0000 mg | ORAL_TABLET | Freq: Once | ORAL | Status: AC
  Administered 2016-11-09: 10 mg via ORAL
  Filled 2016-11-09: qty 1

## 2016-11-09 MED ORDER — SUCCINYLCHOLINE CHLORIDE 20 MG/ML IJ SOLN
INTRAMUSCULAR | Status: DC | PRN
  Administered 2016-11-09: 120 mg via INTRAVENOUS

## 2016-11-09 MED ORDER — 0.9 % SODIUM CHLORIDE (POUR BTL) OPTIME
TOPICAL | Status: DC | PRN
  Administered 2016-11-09: 1000 mL

## 2016-11-09 MED ORDER — ONDANSETRON HCL 4 MG/2ML IJ SOLN
INTRAMUSCULAR | Status: DC | PRN
  Administered 2016-11-09: 4 mg via INTRAVENOUS

## 2016-11-09 MED ORDER — EPHEDRINE 5 MG/ML INJ
INTRAVENOUS | Status: AC
  Filled 2016-11-09: qty 10

## 2016-11-09 MED ORDER — OXYCODONE-ACETAMINOPHEN 5-325 MG PO TABS
1.0000 | ORAL_TABLET | ORAL | Status: DC | PRN
  Administered 2016-11-09: 1 via ORAL

## 2016-11-09 MED ORDER — COCONUT OIL OIL: 1.0000 "application " | TOPICAL_OIL | Status: DC | PRN

## 2016-11-09 MED ORDER — KETOROLAC TROMETHAMINE 30 MG/ML IJ SOLN
INTRAMUSCULAR | Status: DC | PRN
  Administered 2016-11-09: 30 mg via INTRAVENOUS

## 2016-11-09 MED ORDER — HYDROMORPHONE HCL 1 MG/ML IJ SOLN: 0.2500 mg | INTRAMUSCULAR | Status: DC | PRN

## 2016-11-09 MED ORDER — FAMOTIDINE 20 MG PO TABS
40.0000 mg | ORAL_TABLET | Freq: Once | ORAL | Status: AC
  Administered 2016-11-09: 40 mg via ORAL
  Filled 2016-11-09: qty 2

## 2016-11-09 MED ORDER — TETANUS-DIPHTH-ACELL PERTUSSIS 5-2.5-18.5 LF-MCG/0.5 IM SUSP: 0.5000 mL | Freq: Once | INTRAMUSCULAR | Status: DC

## 2016-11-09 MED ORDER — LIDOCAINE HCL (CARDIAC) 20 MG/ML IV SOLN
INTRAVENOUS | Status: DC | PRN
  Administered 2016-11-09: 50 mg via INTRAVENOUS

## 2016-11-09 MED ORDER — ONDANSETRON HCL 4 MG/2ML IJ SOLN: 4.0000 mg | INTRAMUSCULAR | Status: DC | PRN

## 2016-11-09 MED ORDER — SENNOSIDES-DOCUSATE SODIUM 8.6-50 MG PO TABS
2.0000 | ORAL_TABLET | ORAL | Status: DC
  Administered 2016-11-09 (×2): 2 via ORAL
  Filled 2016-11-09 (×2): qty 2

## 2016-11-09 MED ORDER — ONDANSETRON HCL 4 MG PO TABS: 4.0000 mg | ORAL_TABLET | ORAL | Status: DC | PRN

## 2016-11-09 MED ORDER — BUPIVACAINE HCL (PF) 0.25 % IJ SOLN
INTRAMUSCULAR | Status: DC | PRN
  Administered 2016-11-09: 10 mL

## 2016-11-09 MED ORDER — DIPHENHYDRAMINE HCL 50 MG/ML IJ SOLN
12.5000 mg | INTRAMUSCULAR | Status: DC | PRN
Start: 2016-11-09 — End: 2016-11-10

## 2016-11-09 MED ORDER — EPHEDRINE SULFATE 50 MG/ML IJ SOLN
INTRAMUSCULAR | Status: DC | PRN
  Administered 2016-11-09: 10 mg via INTRAVENOUS

## 2016-11-09 MED ORDER — OXYCODONE-ACETAMINOPHEN 5-325 MG PO TABS
ORAL_TABLET | ORAL | Status: AC
  Filled 2016-11-09: qty 1

## 2016-11-09 MED ORDER — ONDANSETRON HCL 4 MG/2ML IJ SOLN
INTRAMUSCULAR | Status: AC
  Filled 2016-11-09: qty 2

## 2016-11-09 MED ORDER — PROMETHAZINE HCL 25 MG/ML IJ SOLN: 6.2500 mg | INTRAMUSCULAR | Status: DC | PRN

## 2016-11-09 MED ORDER — NALBUPHINE HCL 10 MG/ML IJ SOLN: 5.0000 mg | INTRAMUSCULAR | Status: DC | PRN

## 2016-11-09 MED ORDER — SODIUM CHLORIDE 0.9% FLUSH: 3.0000 mL | INTRAVENOUS | Status: DC | PRN

## 2016-11-09 MED ORDER — PROPOFOL 10 MG/ML IV BOLUS
INTRAVENOUS | Status: AC
Start: 2016-11-09 — End: 2016-11-09
  Filled 2016-11-09: qty 20

## 2016-11-09 MED ORDER — ONDANSETRON HCL 4 MG/2ML IJ SOLN: 4.0000 mg | Freq: Three times a day (TID) | INTRAMUSCULAR | Status: DC | PRN

## 2016-11-09 MED ORDER — SIMETHICONE 80 MG PO CHEW
80.0000 mg | CHEWABLE_TABLET | ORAL | Status: DC | PRN
  Administered 2016-11-09 – 2016-11-10 (×3): 80 mg via ORAL
  Filled 2016-11-09 (×3): qty 1

## 2016-11-09 MED ORDER — NALOXONE HCL 0.4 MG/ML IJ SOLN: 0.4000 mg | INTRAMUSCULAR | Status: DC | PRN

## 2016-11-09 MED ORDER — WITCH HAZEL-GLYCERIN EX PADS: 1.0000 "application " | MEDICATED_PAD | CUTANEOUS | Status: DC | PRN

## 2016-11-09 MED ORDER — MIDAZOLAM HCL 2 MG/2ML IJ SOLN
INTRAMUSCULAR | Status: DC | PRN
  Administered 2016-11-09: 2 mg via INTRAVENOUS

## 2016-11-09 MED ORDER — ACETAMINOPHEN 325 MG PO TABS
650.0000 mg | ORAL_TABLET | ORAL | Status: DC | PRN
  Administered 2016-11-10 (×2): 650 mg via ORAL
  Filled 2016-11-09 (×2): qty 2

## 2016-11-09 MED ORDER — DIPHENHYDRAMINE HCL 25 MG PO CAPS: 25.0000 mg | ORAL_CAPSULE | ORAL | Status: DC | PRN

## 2016-11-09 MED ORDER — DEXAMETHASONE SODIUM PHOSPHATE 4 MG/ML IJ SOLN
INTRAMUSCULAR | Status: AC
  Filled 2016-11-09: qty 1

## 2016-11-09 MED ORDER — FENTANYL CITRATE (PF) 100 MCG/2ML IJ SOLN
INTRAMUSCULAR | Status: DC | PRN
  Administered 2016-11-09: 50 ug via INTRAVENOUS
  Administered 2016-11-09 (×2): 100 ug via INTRAVENOUS

## 2016-11-09 MED ORDER — DIPHENHYDRAMINE HCL 25 MG PO CAPS: 25.0000 mg | ORAL_CAPSULE | Freq: Four times a day (QID) | ORAL | Status: DC | PRN

## 2016-11-09 MED ORDER — MIDAZOLAM HCL 2 MG/2ML IJ SOLN
INTRAMUSCULAR | Status: AC
  Filled 2016-11-09: qty 2

## 2016-11-09 MED ORDER — BENZOCAINE-MENTHOL 20-0.5 % EX AERO
1.0000 "application " | INHALATION_SPRAY | CUTANEOUS | Status: DC | PRN
  Filled 2016-11-09: qty 56

## 2016-11-09 MED ORDER — DEXAMETHASONE SODIUM PHOSPHATE 10 MG/ML IJ SOLN
INTRAMUSCULAR | Status: DC | PRN
  Administered 2016-11-09: 4 mg via INTRAVENOUS

## 2016-11-09 SURGICAL SUPPLY — 20 items
CLIP FILSHIE TUBAL LIGA STRL (Clip) ×3 IMPLANT
CLOTH BEACON ORANGE TIMEOUT ST (SAFETY) ×3 IMPLANT
DRSG OPSITE POSTOP 3X4 (GAUZE/BANDAGES/DRESSINGS) ×3 IMPLANT
DURAPREP 26ML APPLICATOR (WOUND CARE) ×3 IMPLANT
GLOVE BIO SURGEON STRL SZ7 (GLOVE) ×3 IMPLANT
GLOVE BIOGEL PI IND STRL 7.0 (GLOVE) ×2 IMPLANT
GLOVE BIOGEL PI INDICATOR 7.0 (GLOVE) ×4
GOWN STRL REUS W/TWL LRG LVL3 (GOWN DISPOSABLE) ×6 IMPLANT
NEEDLE HYPO 22GX1.5 SAFETY (NEEDLE) ×3 IMPLANT
NS IRRIG 1000ML POUR BTL (IV SOLUTION) ×3 IMPLANT
PACK ABDOMINAL MINOR (CUSTOM PROCEDURE TRAY) ×3 IMPLANT
PROTECTOR NERVE ULNAR (MISCELLANEOUS) ×3 IMPLANT
SPONGE LAP 4X18 X RAY DECT (DISPOSABLE) ×3 IMPLANT
SUT VIC AB 0 CT1 27 (SUTURE) ×2
SUT VIC AB 0 CT1 27XBRD ANBCTR (SUTURE) ×1 IMPLANT
SUT VICRYL 4-0 PS2 18IN ABS (SUTURE) ×3 IMPLANT
SYR CONTROL 10ML LL (SYRINGE) ×3 IMPLANT
TOWEL OR 17X24 6PK STRL BLUE (TOWEL DISPOSABLE) ×6 IMPLANT
TRAY FOLEY CATH SILVER 14FR (SET/KITS/TRAYS/PACK) ×3 IMPLANT
WATER STERILE IRR 1000ML POUR (IV SOLUTION) ×3 IMPLANT

## 2016-11-09 NOTE — Transfer of Care (Signed)
Immediate Anesthesia Transfer of Care Note  Patient: Judith Fisher  Procedure(s) Performed: Procedure(s): POST PARTUM TUBAL LIGATION (Bilateral)  Patient Location: PACU  Anesthesia Type:General  Level of Consciousness: awake, alert  and oriented  Airway & Oxygen Therapy: Patient Spontanous Breathing and Patient connected to nasal cannula oxygen  Post-op Assessment: Report given to RN and Post -op Vital signs reviewed and stable  Post vital signs: Reviewed and stable  Last Vitals:  Vitals:   11/09/16 0130 11/09/16 0549  BP: 103/66 103/66  Pulse: 77 69  Resp: 20 20  Temp: 36.9 C     Last Pain:  Vitals:   11/09/16 0549  TempSrc:   PainSc: 4       Patients Stated Pain Goal: 3 (11/08/16 2030)  Complications: No apparent anesthesia complications

## 2016-11-09 NOTE — Progress Notes (Signed)
POSTPARTUM PROGRESS NOTE  Post Partum Day 1 Subjective:  Judith Fisher is a 25 y.o. G3P3003 1256w0d s/p SVD.  No acute events overnight.  Pt denies problems with ambulating, voiding or po intake.  She denies nausea or vomiting.  Pain is moderately controlled.  She has had flatus. She has not had bowel movement.  Lochia Small.   Objective: Blood pressure 103/66, pulse 69, temperature 98.4 F (36.9 C), temperature source Oral, resp. rate 20, height 5\' 3"  (1.6 m), weight 149 lb (67.6 kg), last menstrual period 02/09/2016, SpO2 98 %, unknown if currently breastfeeding.  Physical Exam:  General: alert, cooperative and no distress Lochia:normal flow Chest: no respiratory distress Heart:regular rate, distal pulses intact Abdomen: soft, nontender,  Uterine Fundus: firm, appropriately tender DVT Evaluation: No calf swelling or tenderness Extremities: trace edema   Recent Labs  11/08/16 1500 11/09/16 0610  HGB 13.5 11.7*  HCT 39.9 35.0*    Assessment/Plan:  ASSESSMENT: Judith Fisher is a 25 y.o. G3P3003 6056w0d s/p SVD  Plan for discharge tomorrow  Tubal today   LOS: 1 day   Les Pouicholas SchenkMD 11/09/2016, 8:35 AM

## 2016-11-09 NOTE — Anesthesia Postprocedure Evaluation (Signed)
Anesthesia Post Note  Patient: Judith Fisher  Procedure(s) Performed: Procedure(s) (LRB): POST PARTUM TUBAL LIGATION (Bilateral)  Patient location during evaluation: PACU Anesthesia Type: General Level of consciousness: sedated Pain management: pain level controlled Vital Signs Assessment: post-procedure vital signs reviewed and stable Respiratory status: spontaneous breathing and respiratory function stable Cardiovascular status: stable Anesthetic complications: no     Last Vitals:  Vitals:   11/09/16 1145 11/09/16 1219  BP: 104/73 (!) 100/58  Pulse: 62 75  Resp: 14 16  Temp: 36.8 C 36.9 C    Last Pain:  Vitals:   11/09/16 1219  TempSrc:   PainSc: 4    Pain Goal: Patients Stated Pain Goal: 3 (11/08/16 2030)               Heather RobertsSINGER,Maelin Kurkowski DANIEL

## 2016-11-09 NOTE — Anesthesia Procedure Notes (Signed)
Procedure Name: Intubation Date/Time: 11/09/2016 9:57 AM Performed by: Shanon PayorGREGORY, Jeanna Giuffre M Pre-anesthesia Checklist: Patient identified, Emergency Drugs available, Suction available, Timeout performed and Patient being monitored Patient Re-evaluated:Patient Re-evaluated prior to inductionOxygen Delivery Method: Circle system utilized Preoxygenation: Pre-oxygenation with 100% oxygen Intubation Type: IV induction and Rapid sequence Grade View: Grade I Tube type: Oral Tube size: 7.0 mm Number of attempts: 1 Airway Equipment and Method: Stylet Placement Confirmation: ETT inserted through vocal cords under direct vision,  positive ETCO2 and breath sounds checked- equal and bilateral Secured at: 22 cm Tube secured with: Tape Dental Injury: Teeth and Oropharynx as per pre-operative assessment

## 2016-11-09 NOTE — Op Note (Signed)
Judith DriverKrystel M Fisher 11/08/2016 - 11/09/2016  PREOPERATIVE DIAGNOSIS:  Undesired fertility  POSTOPERATIVE DIAGNOSIS:  Undesired fertility  PROCEDURE:  Postpartum Bilateral Tubal Sterilization using Filshie Clips   SURGEON:  Elsie LincolnKelly Melora Menon, MD  ASSISTANT: Jen MowElizabeth Mumaw, DO  ANESTHESIA:  Epidural  COMPLICATIONS:  None immediate.  ESTIMATED BLOOD LOSS:  Less than 5cc.    FLUIDS: 800 cc LR.  URINE OUTPUT:  50 cc of clear urine.  INDICATIONS: 25 y.o. yo [phone]  with undesired fertility,status post vaginal delivery, desires permanent sterilization. Risks and benefits of procedure discussed with patient including permanence of method, bleeding, infection, injury to surrounding organs and need for additional procedures. Risk failure of 0.5-1% with increased risk of ectopic gestation if pregnancy occurs was also discussed with patient.   FINDINGS:  Normal uterus, tubes, and ovaries.  TECHNIQUE:  The patient was taken to the operating room where spinal anesthesia was placed and found to be adequate.  She was then placed in the dorsal supine position and prepped and draped in sterile fashion.  After an adequate timeout was performed, attention was turned to the patient's abdomen where a small vertical skin incision was made through the umbilical plate. The incision was taken down to the layer of fascia using the scalpel, and fascia was incised, and extended bilaterally using Mayo scissors. The peritoneum was entered in a sharp fashion. Attention was then turned to the patient's uterus, and left fallopian tube was identified and followed out to the fimbriated end.  A Filshie clip was placed on the left fallopian tube about 2 cm from the cornual attachment, with care given to incorporate the underlying mesosalpinx.  A similar process was carried out on the right side allowing for bilateral tubal sterilization.  Good hemostasis was noted overall. The instruments were then removed from the patient's abdomen  and the fascial incision was repaired with 0 Vicryl, 10cc 0.25% Marcaine was injected subcutaneously, and the skin was closed with a 3-0 Vicryl subcuticular stitch. The patient tolerated the procedure well.  Sponge, lap, and needle counts were correct times two.  The patient was then taken to the recovery room awake, extubated and in stable condition.   [address] Lanelizabeth Woodland MelvinMumaw, [place] OB Fellow 11/09/2016 10:37 AM   Scrubbed for procedure.  Attestation of Attending Supervision of Fellow: Evaluation and management procedures were performed by the Fellow under my supervision and collaboration. I have reviewed the Fellow's note and chart, and I agree with the management and plan.  Lesly DukesLEGGETT,Rockford Leinen H., MD

## 2016-11-09 NOTE — Progress Notes (Signed)
Patient ID: Judith Fisher, female   DOB: September 27, 1991, 25 y.o.   MRN: [id number]  Patient desires permanent sterilization.  Other reversible forms of contraception were discussed with patient; she declines all other modalities. Risks of procedure discussed with patient including but not limited to: risk of regret, permanence of method, bleeding, infection, injury to surrounding organs and need for additional procedures.  Failure risk of 0.5-1% with increased risk of ectopic gestation if pregnancy occurs was also discussed with patient.    Patient verbalized understanding of these risks and wants to proceed with sterilization.  Written informed consent obtained.  To OR when ready.

## 2016-11-09 NOTE — Anesthesia Preprocedure Evaluation (Signed)
Anesthesia Evaluation  Patient identified by MRN, date of birth, ID band Patient awake    Reviewed: Allergy & Precautions, NPO status , Patient's Chart, lab work & pertinent test results  History of Anesthesia Complications Negative for: history of anesthetic complications  Airway Mallampati: II  TM Distance: >3 FB Neck ROM: Full    Dental  (+) Dental Advisory Given, Poor Dentition   Pulmonary Current Smoker,    Pulmonary exam normal        Cardiovascular negative cardio ROS Normal cardiovascular exam     Neuro/Psych negative neurological ROS  negative psych ROS   GI/Hepatic negative GI ROS, Neg liver ROS,   Endo/Other  negative endocrine ROS  Renal/GU negative Renal ROS     Musculoskeletal   Abdominal   Peds  Hematology   Anesthesia Other Findings   Reproductive/Obstetrics                            Anesthesia Physical Anesthesia Plan  ASA: II  Anesthesia Plan: General   Post-op Pain Management:    Induction: Intravenous  Airway Management Planned: Oral ETT and LMA  Additional Equipment:   Intra-op Plan:   Post-operative Plan: Extubation in OR  Informed Consent: I have reviewed the patients History and Physical, chart, labs and discussed the procedure including the risks, benefits and alternatives for the proposed anesthesia with the patient or authorized representative who has indicated his/her understanding and acceptance.     Plan Discussed with: CRNA, Anesthesiologist and Surgeon  Anesthesia Plan Comments:         Anesthesia Quick Evaluation

## 2016-11-10 ENCOUNTER — Encounter (HOSPITAL_COMMUNITY): Payer: Self-pay | Admitting: Obstetrics & Gynecology

## 2016-11-10 LAB — CULTURE, BETA STREP (GROUP B ONLY)

## 2016-11-10 LAB — GC/CHLAMYDIA PROBE AMP (~~LOC~~) NOT AT ARMC
Chlamydia: NEGATIVE
NEISSERIA GONORRHEA: NEGATIVE

## 2016-11-10 MED ORDER — BUTALBITAL-APAP-CAFFEINE 50-325-40 MG PO TABS: 1.0000 | ORAL_TABLET | Freq: Four times a day (QID) | ORAL | 0 refills | Status: AC | PRN

## 2016-11-10 MED ORDER — IBUPROFEN 600 MG PO TABS: 600.0000 mg | ORAL_TABLET | Freq: Four times a day (QID) | ORAL | 0 refills | Status: DC

## 2016-11-10 MED ORDER — PRENATAL MULTIVITAMIN CH: 1.0000 | ORAL_TABLET | Freq: Every day | ORAL | 11 refills | Status: DC

## 2016-11-10 NOTE — Discharge Summary (Signed)
OB Discharge Summary  Patient Name: Judith DriverKrystel M Fisher DOB: August 27, 1991 MRN: [id number]  Date of admission: 11/08/2016 Delivering MD: Lorne SkeensSCHENK, NICHOLAS MICHAEL   Date of discharge: 11/10/2016  Admitting diagnosis: 39WKS LABOR  desires sterilization  Intrauterine pregnancy: 8110w0d     Secondary diagnosis:Active Problems:   Normal labor   Limited prenatal care  Additional problems:none     Discharge diagnosis: Term Pregnancy Delivered                                                                     Post partum procedures:n/a  Augmentation: Pitocin  Complications: None  Hospital course:  Onset of Labor With Vaginal Delivery     25 y.o. yo G3P3003 at 7610w0d was admitted in Latent Labor on 11/08/2016. Patient had an uncomplicated labor course as follows:  Membrane Rupture Time/Date: 7:02 PM ,11/08/2016   Intrapartum Procedures: Episiotomy: None [1]                                         Lacerations:  1st degree [2]  Patient had a delivery of a Viable infant. 11/08/2016  Information for the patient's newborn:  Maureen Chatterslliott, Girl Leasa [[id number]][[id number]]  Delivery Method: Vaginal, Spontaneous Delivery (Filed from Delivery Summary)    Pateint had an uncomplicated postpartum course.  She is ambulating, tolerating a regular diet, passing flatus, and urinating well. Patient is discharged home in stable condition on 11/10/16.    Physical exam Vitals:   11/09/16 1342 11/09/16 1807 11/09/16 2215 11/10/16 0622  BP: 100/60 (!) 98/57 (!) 97/56 110/62  Pulse: 74 62 69 72  Resp: 18  18 18   Temp: 98.3 F (36.8 C) 98.5 F (36.9 C) 98.5 F (36.9 C) 98.3 F (36.8 C)  TempSrc:   Oral Oral  SpO2:   99%   Weight:      Height:       General: alert, cooperative and no distress Lochia: appropriate Uterine Fundus: firm Incision: N/A DVT Evaluation: No evidence of DVT seen on physical exam. Labs: Lab Results  Component Value Date   WBC 20.5 (H) 11/09/2016   HGB 11.7 (L) 11/09/2016   HCT  35.0 (L) 11/09/2016   MCV 86.0 11/09/2016   PLT 260 11/09/2016   No flowsheet data found.  Discharge instruction: per After Visit Summary and "Baby and Me Booklet".  After Visit Meds:    Medication List    TAKE these medications   butalbital-acetaminophen-caffeine 50-325-40 MG tablet Commonly known as:  FIORICET, ESGIC Take 1-2 tablets by mouth every 6 (six) hours as needed for headache.   ibuprofen 600 MG tablet Commonly known as:  ADVIL,MOTRIN Take 1 tablet (600 mg total) by mouth every 6 (six) hours.   prenatal multivitamin Tabs tablet Take 1 tablet by mouth daily at 12 noon.       Diet: routine diet  Activity: Advance as tolerated. Pelvic rest for 6 weeks.   Outpatient follow up:6 weeks Follow up Appt:No future appointments. Follow up visit: No Follow-up on file.  Postpartum contraception: Undecided  Newborn Data: Live born female  Birth Weight: 7 lb 6.5 oz (3359 g) APGAR:  8, 9  Baby Feeding: Breast Disposition:home with mother   11/10/2016 Wyvonnia DuskyMarie Evelyn Moch, CNM

## 2016-11-10 NOTE — Discharge Summary (Signed)
OB Discharge Summary  Patient Name: Judith DriverKrystel M Cabal DOB: 10/13/1991 MRN: [id number]  Date of admission: 11/08/2016 Delivering MD: Lorne SkeensSCHENK, NICHOLAS MICHAEL   Date of discharge: 11/10/2016  Admitting diagnosis: 39WKS LABOR  desires sterilization  Intrauterine pregnancy: 2418w0d     Secondary diagnosis:Active Problems:   Normal labor   Limited prenatal care  Additional problems:none     Discharge diagnosis: Term Pregnancy Delivered                                                                     Post partum procedures:postpartum tubal ligation  Augmentation: Pitocin  Complications: None  Hospital course:  Onset of Labor With Vaginal Delivery     25 y.o. yo G3P3003 at 5918w0d was admitted in Latent Labor on 11/08/2016. Patient had an uncomplicated labor course as follows:  Membrane Rupture Time/Date: 7:02 PM ,11/08/2016   Intrapartum Procedures: Episiotomy: None [1]                                         Lacerations:  1st degree [2]  Patient had a delivery of a Viable infant. 11/08/2016  Information for the patient's newborn:  Maureen Chatterslliott, Girl Chardai [[id number]][[id number]]  Delivery Method: Vaginal, Spontaneous Delivery (Filed from Delivery Summary)    Pateint had an uncomplicated postpartum course.  She is ambulating, tolerating a regular diet, passing flatus, and urinating well. Patient is discharged home in stable condition on 11/10/16.    Physical exam Vitals:   11/09/16 1342 11/09/16 1807 11/09/16 2215 11/10/16 0622  BP: 100/60 (!) 98/57 (!) 97/56 110/62  Pulse: 74 62 69 72  Resp: 18  18 18   Temp: 98.3 F (36.8 C) 98.5 F (36.9 C) 98.5 F (36.9 C) 98.3 F (36.8 C)  TempSrc:   Oral Oral  SpO2:   99%   Weight:      Height:       General: alert, cooperative and no distress Lochia: appropriate Uterine Fundus: firm Incision: Healing well with no significant drainage, No significant erythema, Dressing is clean, dry, and intact DVT Evaluation: No evidence of DVT seen on  physical exam. Labs: Lab Results  Component Value Date   WBC 20.5 (H) 11/09/2016   HGB 11.7 (L) 11/09/2016   HCT 35.0 (L) 11/09/2016   MCV 86.0 11/09/2016   PLT 260 11/09/2016   No flowsheet data found.  Discharge instruction: per After Visit Summary and "Baby and Me Booklet".  After Visit Meds:    Medication List    TAKE these medications   butalbital-acetaminophen-caffeine 50-325-40 MG tablet Commonly known as:  FIORICET, ESGIC Take 1-2 tablets by mouth every 6 (six) hours as needed for headache.   ibuprofen 600 MG tablet Commonly known as:  ADVIL,MOTRIN Take 1 tablet (600 mg total) by mouth every 6 (six) hours.   prenatal multivitamin Tabs tablet Take 1 tablet by mouth daily at 12 noon.       Diet: routine diet  Activity: Advance as tolerated. Pelvic rest for 6 weeks.   Outpatient follow up:6 weeks Follow up Appt:No future appointments. Follow up visit: No Follow-up on file.  Postpartum contraception: Tubal Ligation  Newborn Data: Live born female  Birth Weight: 7 lb 6.5 oz (3359 g) APGAR: 8, 9  Baby Feeding: Breast Disposition:home with mother   11/10/2016 Wyvonnia DuskyMarie Moo Gravley, CNM

## 2016-11-10 NOTE — Addendum Note (Signed)
Addendum  created 11/10/16 0746 by Junious SilkMelinda Ilea Hilton, CRNA   Sign clinical note

## 2016-11-10 NOTE — Anesthesia Postprocedure Evaluation (Signed)
Anesthesia Post Note  Patient: Chriss DriverKrystel M Lager  Procedure(s) Performed: Procedure(s) (LRB): POST PARTUM TUBAL LIGATION (Bilateral)  Patient location during evaluation: Mother Baby Anesthesia Type: General Level of consciousness: awake and alert Pain management: pain level controlled Vital Signs Assessment: post-procedure vital signs reviewed and stable Respiratory status: spontaneous breathing, nonlabored ventilation and respiratory function stable Cardiovascular status: blood pressure returned to baseline and stable Postop Assessment: no signs of nausea or vomiting Anesthetic complications: no     Last Vitals:  Vitals:   11/10/16 0200 11/10/16 0622  BP: (!) 98/51 110/62  Pulse: 62 72  Resp: 18 18  Temp: 36.8 C 36.8 C    Last Pain:  Vitals:   11/10/16 0622  TempSrc: Oral  PainSc:    Pain Goal: Patients Stated Pain Goal: 3 (11/10/16 [id number])               Junious SilkGILBERT,Bess Saltzman

## 2016-12-17 ENCOUNTER — Other Ambulatory Visit: Payer: Self-pay | Admitting: Obstetrics and Gynecology

## 2016-12-29 ENCOUNTER — Encounter (HOSPITAL_COMMUNITY): Payer: Self-pay

## 2017-03-27 ENCOUNTER — Ambulatory Visit
Admission: RE | Admit: 2017-03-27 | Discharge: 2017-03-27 | Disposition: A | Discharge status: Home / Self Care | Payer: Medicaid Other | Source: Ambulatory Visit | Attending: Family Medicine | Admitting: Family Medicine

## 2017-03-27 ENCOUNTER — Other Ambulatory Visit: Payer: Self-pay | Admitting: Family Medicine

## 2017-03-27 DIAGNOSIS — R52 Pain, unspecified: Secondary | ICD-10-CM

## 2017-09-18 NOTE — Other (Unsigned)
Patient Acct Nbr: [id number]   Primary AUTH/CERT:   Primary Insurance Company Name: [employer] [facility] [employer] [facility] [employer]  Primary Insurance Plan name: [employer] Blue Cross  Primary Insurance Group Number: [id number]  Primary Insurance Plan Type: Health  Primary Insurance Policy Number: [id number]

## 2017-11-05 NOTE — Other (Unsigned)
Patient Acct Nbr: [id number]   Primary AUTH/CERT:   Primary Insurance Company Name: [employer]achael [employer]eeAnthem Blue [employer] [facility] [employer]  Primary Insurance Plan name: [employer] Blue Cross  Primary Insurance Group Number: [id number]  Primary Insurance Plan Type: Health  Primary Insurance Policy Number: [id number]

## 2017-12-07 IMAGING — DX DG HAND COMPLETE 3+V*R*
3 series · 3 of 3 positions shown · non-contrast
Comparison: None.

CLINICAL DATA: Wrist pain for 6 months.

EXAM:
RIGHT HAND - COMPLETE 3+ VIEW

[dg hand complete right (1 of 3)]
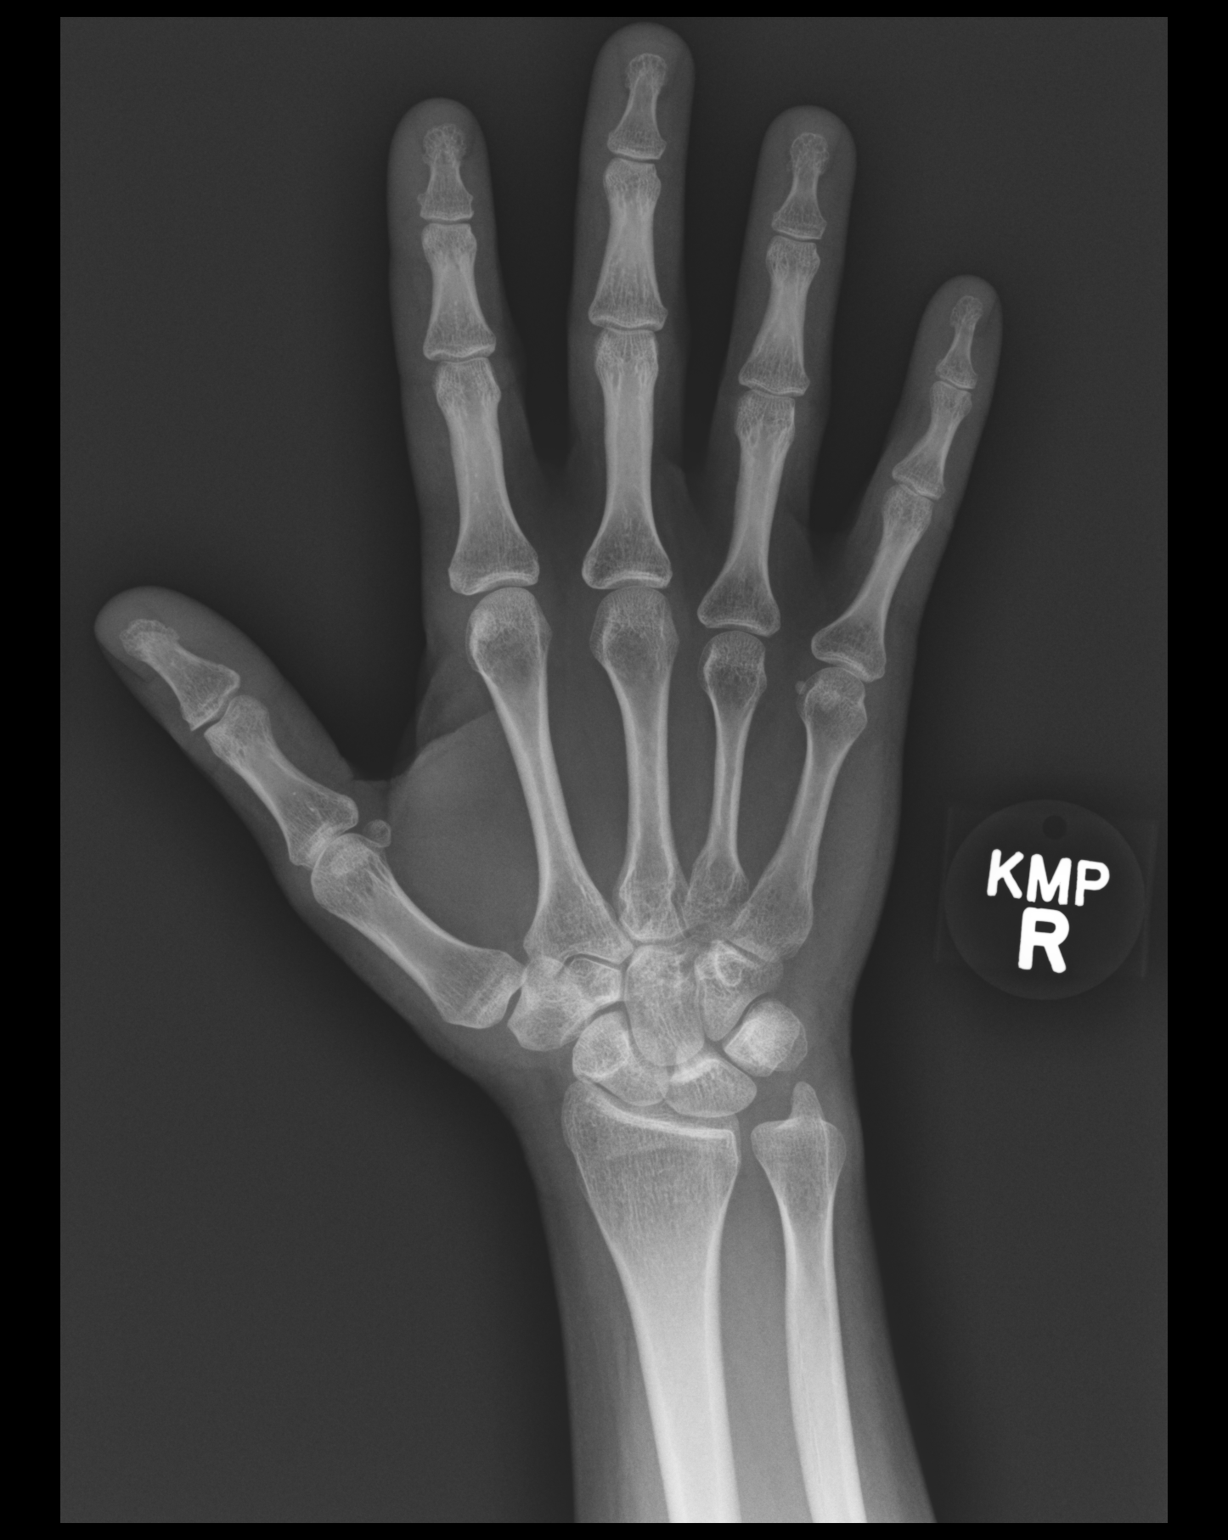

[dg hand complete right (2 of 3)]
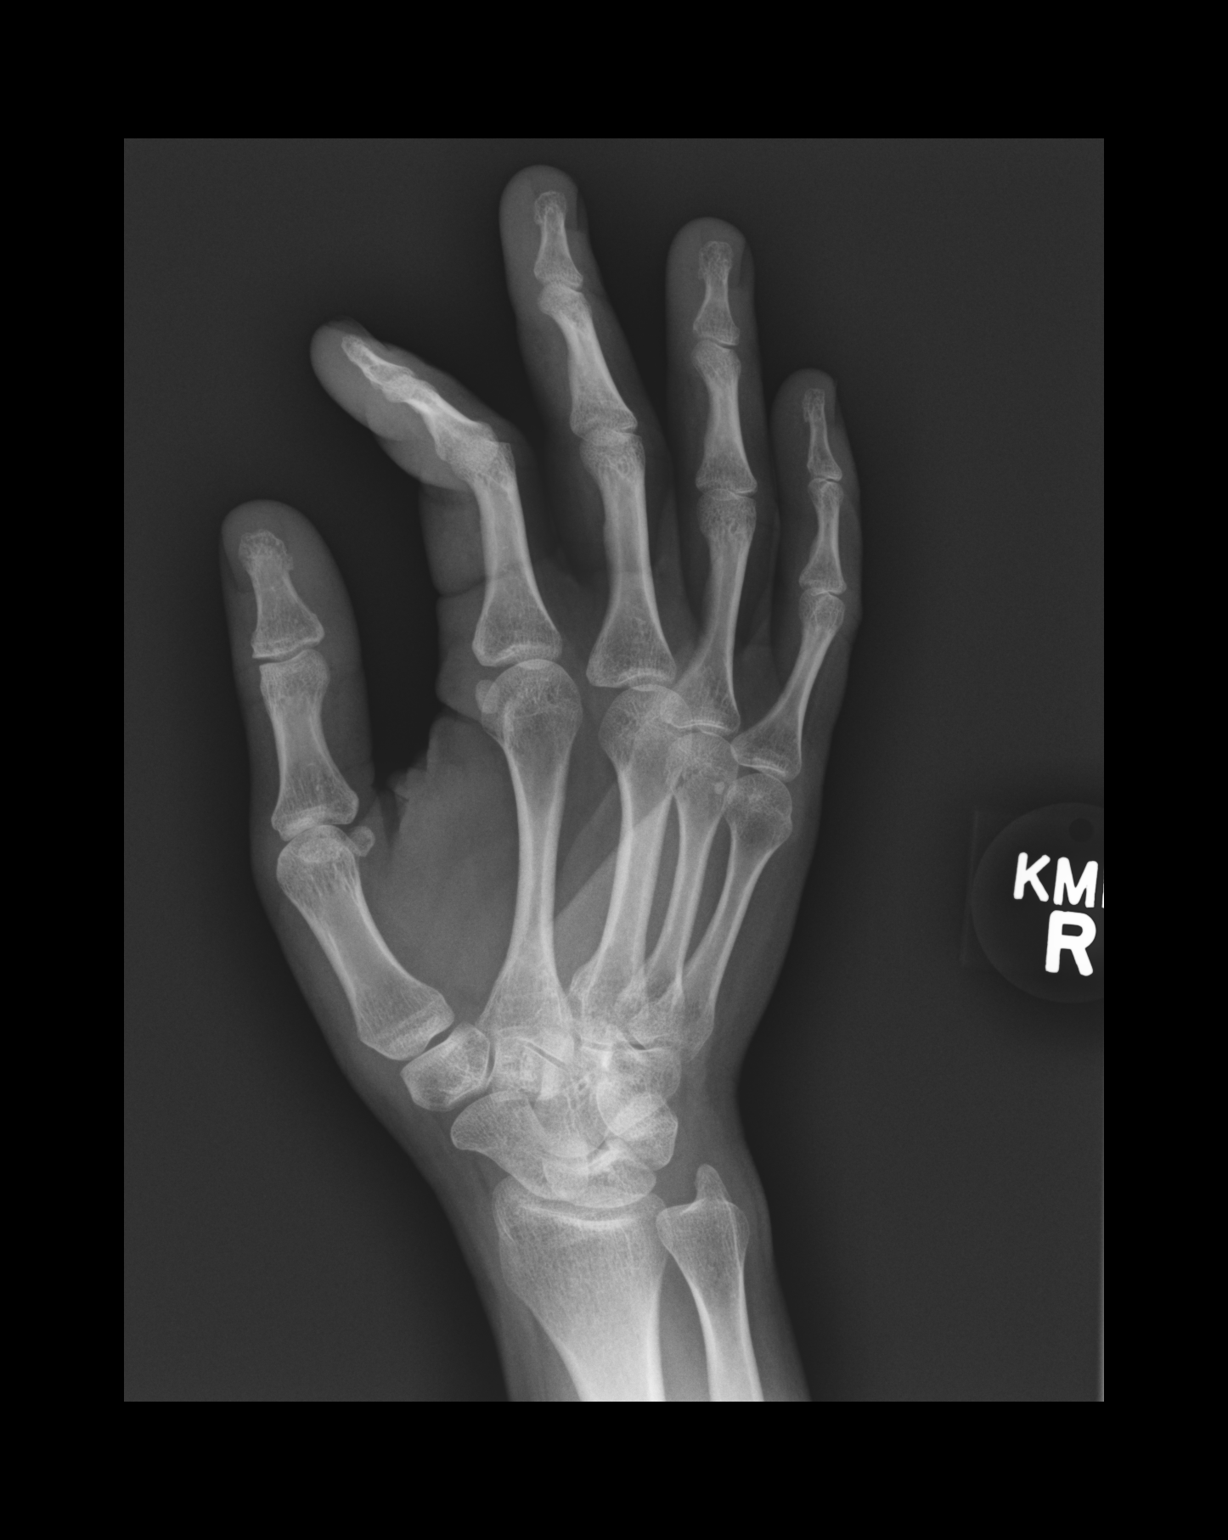

[dg hand complete right (3 of 3)]
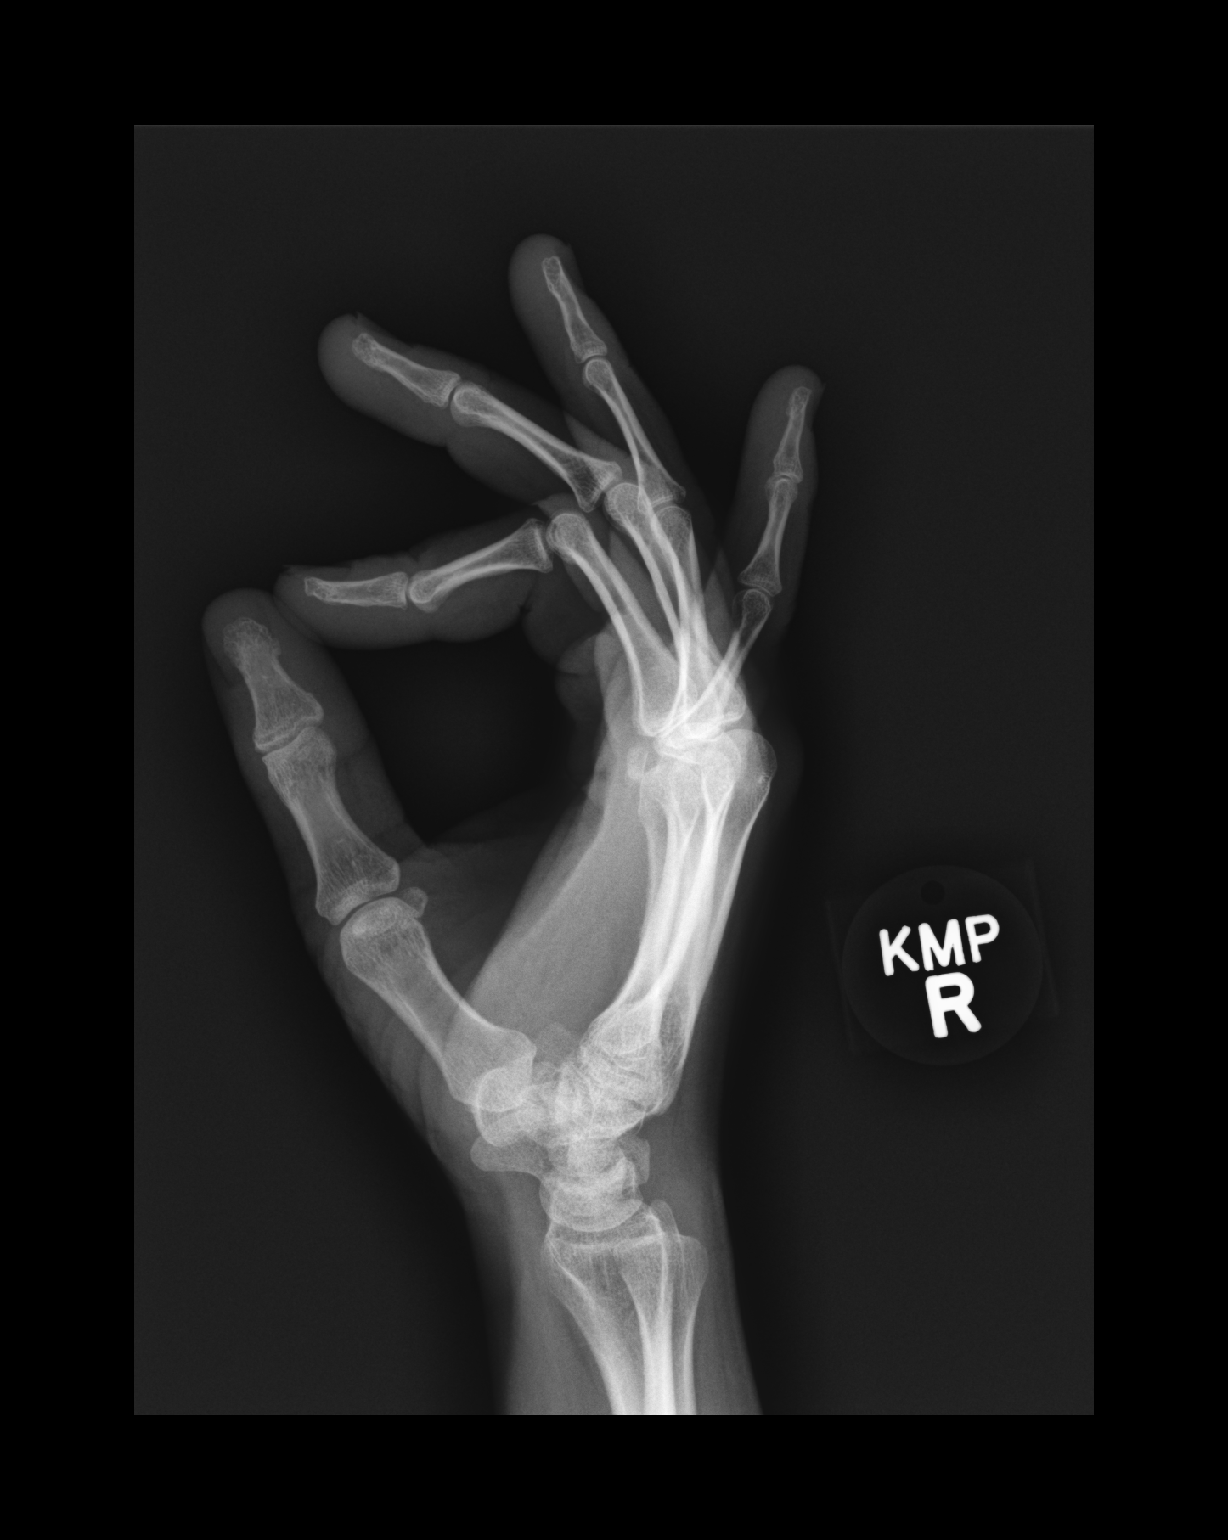

[3 of 3 positions shown; findings below may reference images not displayed]

FINDINGS: There is no evidence of fracture or dislocation. There is no
evidence of arthropathy or other focal bone abnormality. Soft
tissues are unremarkable.
IMPRESSION: Negative.

## 2017-12-16 NOTE — H&P (Signed)
HISTORY AND PHYSICAL  Judith Fisher is a 27 y.o. female patient with CC: painful teeth  No diagnosis found.  Past Medical History:  Diagnosis Date  . Abnormal Pap smear of cervix    Several years ago    No current facility-administered medications for this encounter.    Current Outpatient Medications  Medication Sig Dispense Refill  . ibuprofen (ADVIL,MOTRIN) 600 MG tablet TAKE 1 TABLET BY MOUTH EVERY 6 HOURS 30 tablet 0  . Prenatal Vit-Fe Fumarate-FA (PRENATAL MULTIVITAMIN) TABS tablet Take 1 tablet by mouth daily at 12 noon. 30 tablet 11   No Known Allergies Active Problems:   * No active hospital problems. *  Vitals: unknown if currently breastfeeding. Lab results:No results found for this or any previous visit (from the past 24 hour(s)). Radiology Results: No results found. General appearance: alert, cooperative and no distress Head: Normocephalic, without obvious abnormality, atraumatic Eyes: conjunctivae/corneas clear. PERRL, EOM's intact. Fundi benign. Nose: Nares normal. Septum midline. Mucosa normal. No drainage or sinus tenderness. Throat: grossly decayed teeth. No purulence, tismus, lesions. Pharynx clear.  Neck: no adenopathy, supple, symmetrical, trachea midline and thyroid not enlarged, symmetric, no tenderness/mass/nodules Resp: clear to auscultation bilaterally Cardio: regular rate and rhythm, S1, S2 normal, no murmur, click, rub or gallop  Assessment:all teeth nonrestorable secondary to dental caries  Plan: Full mouth extraction with alveoloplasty. GA. Nasal. Day surgery.   Judith Fisher 12/16/2017

## 2017-12-17 NOTE — Progress Notes (Signed)
Several unsuccessful attempts have been made to contact pt; lvm with pre-op instructions on pt phone. Pt made aware to stop taking Aspirin,vitamins, fish oil and herbal medications. Do not take any NSAIDs ie: Ibuprofen, Advil, Naproxen (Aleve). Motrin, BC and Goody Powder. Please complete assessment DOS.

## 2017-12-18 ENCOUNTER — Ambulatory Visit (HOSPITAL_COMMUNITY)
Admission: RE | Admit: 2017-12-18 | Discharge: 2017-12-18 | Disposition: A | Discharge status: Home / Self Care | Payer: Medicaid Other | Source: Ambulatory Visit | Attending: Oral Surgery | Admitting: Oral Surgery

## 2017-12-18 ENCOUNTER — Other Ambulatory Visit: Payer: Self-pay

## 2017-12-18 ENCOUNTER — Ambulatory Visit (HOSPITAL_COMMUNITY): Payer: Medicaid Other | Admitting: Certified Registered Nurse Anesthetist

## 2017-12-18 ENCOUNTER — Encounter (HOSPITAL_COMMUNITY)
Admission: RE | Disposition: A | Discharge status: Home / Self Care | Payer: Self-pay | Source: Ambulatory Visit | Attending: Oral Surgery

## 2017-12-18 ENCOUNTER — Encounter (HOSPITAL_COMMUNITY): Payer: Self-pay | Admitting: *Deleted

## 2017-12-18 DIAGNOSIS — K029 Dental caries, unspecified: Secondary | ICD-10-CM | POA: Insufficient documentation

## 2017-12-18 DIAGNOSIS — K011 Impacted teeth: Secondary | ICD-10-CM | POA: Diagnosis not present

## 2017-12-18 HISTORY — PX: TOOTH EXTRACTION: SHX859

## 2017-12-18 HISTORY — DX: Unspecified convulsions: R56.9

## 2017-12-18 LAB — HCG, SERUM, QUALITATIVE: PREG SERUM: NEGATIVE

## 2017-12-18 LAB — HEMOGLOBIN: Hemoglobin: 15.2 g/dL — ABNORMAL HIGH (ref 12.0–15.0)

## 2017-12-18 SURGERY — DENTAL RESTORATION/EXTRACTIONS
Anesthesia: General | Site: Mouth

## 2017-12-18 MED ORDER — ROCURONIUM BROMIDE 100 MG/10ML IV SOLN
INTRAVENOUS | Status: DC | PRN
  Administered 2017-12-18: 50 mg via INTRAVENOUS

## 2017-12-18 MED ORDER — FENTANYL CITRATE (PF) 250 MCG/5ML IJ SOLN
INTRAMUSCULAR | Status: AC
  Filled 2017-12-18: qty 5

## 2017-12-18 MED ORDER — OXYCODONE-ACETAMINOPHEN 5-325 MG PO TABS: 1.0000 | ORAL_TABLET | Freq: Four times a day (QID) | ORAL | 0 refills | Status: AC | PRN

## 2017-12-18 MED ORDER — LIDOCAINE-EPINEPHRINE 2 %-1:100000 IJ SOLN
INTRAMUSCULAR | Status: DC | PRN
  Administered 2017-12-18: 20 mL via INTRADERMAL

## 2017-12-18 MED ORDER — MIDAZOLAM HCL 2 MG/2ML IJ SOLN
INTRAMUSCULAR | Status: AC
  Filled 2017-12-18: qty 2

## 2017-12-18 MED ORDER — SODIUM CHLORIDE 0.9 % IR SOLN
Status: DC | PRN
  Administered 2017-12-18 (×2): 1000 mL

## 2017-12-18 MED ORDER — MEPERIDINE HCL 25 MG/ML IJ SOLN: 6.2500 mg | INTRAMUSCULAR | Status: DC | PRN

## 2017-12-18 MED ORDER — LACTATED RINGERS IV SOLN
INTRAVENOUS | Status: DC
  Administered 2017-12-18 (×2): via INTRAVENOUS

## 2017-12-18 MED ORDER — LIDOCAINE-EPINEPHRINE 2 %-1:100000 IJ SOLN
INTRAMUSCULAR | Status: AC
  Filled 2017-12-18: qty 1

## 2017-12-18 MED ORDER — LIDOCAINE HCL (CARDIAC) 20 MG/ML IV SOLN
INTRAVENOUS | Status: DC | PRN
  Administered 2017-12-18: 80 mg via INTRAVENOUS

## 2017-12-18 MED ORDER — SUGAMMADEX SODIUM 200 MG/2ML IV SOLN
INTRAVENOUS | Status: DC | PRN
  Administered 2017-12-18: 226.8 mg via INTRAVENOUS

## 2017-12-18 MED ORDER — OXYCODONE-ACETAMINOPHEN 5-325 MG PO TABS
ORAL_TABLET | ORAL | Status: AC
  Filled 2017-12-18: qty 1

## 2017-12-18 MED ORDER — OXYCODONE-ACETAMINOPHEN 5-325 MG PO TABS
1.0000 | ORAL_TABLET | Freq: Once | ORAL | Status: AC
  Administered 2017-12-18: 1 via ORAL

## 2017-12-18 MED ORDER — MIDAZOLAM HCL 5 MG/5ML IJ SOLN
INTRAMUSCULAR | Status: DC | PRN
  Administered 2017-12-18: 2 mg via INTRAVENOUS

## 2017-12-18 MED ORDER — PROPOFOL 10 MG/ML IV BOLUS
INTRAVENOUS | Status: DC | PRN
  Administered 2017-12-18: 100 mg via INTRAVENOUS

## 2017-12-18 MED ORDER — ONDANSETRON HCL 4 MG/2ML IJ SOLN: 4.0000 mg | Freq: Once | INTRAMUSCULAR | Status: DC | PRN

## 2017-12-18 MED ORDER — FENTANYL CITRATE (PF) 100 MCG/2ML IJ SOLN
INTRAMUSCULAR | Status: DC | PRN
  Administered 2017-12-18: 100 ug via INTRAVENOUS
  Administered 2017-12-18: 50 ug via INTRAVENOUS

## 2017-12-18 MED ORDER — CEFAZOLIN SODIUM-DEXTROSE 2-4 GM/100ML-% IV SOLN
2.0000 g | INTRAVENOUS | Status: AC
  Administered 2017-12-18: 2 g via INTRAVENOUS
  Filled 2017-12-18: qty 100

## 2017-12-18 MED ORDER — AMOXICILLIN 500 MG PO CAPS: 500.0000 mg | ORAL_CAPSULE | Freq: Three times a day (TID) | ORAL | 0 refills | Status: AC

## 2017-12-18 MED ORDER — HYDROMORPHONE HCL 1 MG/ML IJ SOLN: 0.2500 mg | INTRAMUSCULAR | Status: DC | PRN

## 2017-12-18 MED ORDER — OXYMETAZOLINE HCL 0.05 % NA SOLN
NASAL | Status: AC
  Filled 2017-12-18: qty 15

## 2017-12-18 SURGICAL SUPPLY — 33 items
BLADE SURG 15 STRL LF DISP TIS (BLADE) ×2 IMPLANT
BLADE SURG 15 STRL SS (BLADE) ×4
BUR CROSS CUT FISSURE 1.6 (BURR) ×2 IMPLANT
BUR CROSS CUT FISSURE 1.6MM (BURR) ×1
BUR EGG ELITE 4.0 (BURR) IMPLANT
BUR EGG ELITE 4.0MM (BURR)
CANISTER SUCT 3000ML PPV (MISCELLANEOUS) ×3 IMPLANT
COVER SURGICAL LIGHT HANDLE (MISCELLANEOUS) ×3 IMPLANT
GAUZE PACKING FOLDED 2  STR (GAUZE/BANDAGES/DRESSINGS) ×2
GAUZE PACKING FOLDED 2 STR (GAUZE/BANDAGES/DRESSINGS) ×1 IMPLANT
GLOVE BIO SURGEON STRL SZ 6.5 (GLOVE) ×2 IMPLANT
GLOVE BIO SURGEON STRL SZ7 (GLOVE) IMPLANT
GLOVE BIO SURGEON STRL SZ7.5 (GLOVE) ×3 IMPLANT
GLOVE BIO SURGEONS STRL SZ 6.5 (GLOVE) ×1
GLOVE BIOGEL PI IND STRL 6.5 (GLOVE) ×1 IMPLANT
GLOVE BIOGEL PI IND STRL 7.0 (GLOVE) IMPLANT
GLOVE BIOGEL PI INDICATOR 6.5 (GLOVE) ×2
GLOVE BIOGEL PI INDICATOR 7.0 (GLOVE)
GOWN STRL REUS W/ TWL LRG LVL3 (GOWN DISPOSABLE) ×1 IMPLANT
GOWN STRL REUS W/ TWL XL LVL3 (GOWN DISPOSABLE) ×1 IMPLANT
GOWN STRL REUS W/TWL LRG LVL3 (GOWN DISPOSABLE) ×2
GOWN STRL REUS W/TWL XL LVL3 (GOWN DISPOSABLE) ×2
KIT BASIN OR (CUSTOM PROCEDURE TRAY) ×3 IMPLANT
KIT ROOM TURNOVER OR (KITS) ×3 IMPLANT
NEEDLE 22X1 1/2 (OR ONLY) (NEEDLE) ×6 IMPLANT
NS IRRIG 1000ML POUR BTL (IV SOLUTION) ×3 IMPLANT
PAD ARMBOARD 7.5X6 YLW CONV (MISCELLANEOUS) ×3 IMPLANT
SPONGE SURGIFOAM ABS GEL 12-7 (HEMOSTASIS) IMPLANT
SUT CHROMIC 3 0 PS 2 (SUTURE) ×3 IMPLANT
SYR CONTROL 10ML LL (SYRINGE) ×3 IMPLANT
TRAY ENT MC OR (CUSTOM PROCEDURE TRAY) ×3 IMPLANT
TUBING IRRIGATION (MISCELLANEOUS) ×3 IMPLANT
YANKAUER SUCT BULB TIP NO VENT (SUCTIONS) ×3 IMPLANT

## 2017-12-18 NOTE — Anesthesia Preprocedure Evaluation (Signed)
Anesthesia Evaluation  Patient identified by MRN, date of birth, ID band Patient awake    Reviewed: Allergy & Precautions, NPO status , Patient's Chart, lab work & pertinent test results  Airway Mallampati: I  TM Distance: >3 FB Neck ROM: Full    Dental   Pulmonary Current Smoker,    Pulmonary exam normal        Cardiovascular Normal cardiovascular exam     Neuro/Psych    GI/Hepatic   Endo/Other    Renal/GU      Musculoskeletal   Abdominal   Peds  Hematology   Anesthesia Other Findings   Reproductive/Obstetrics                             Anesthesia Physical Anesthesia Plan  ASA: II  Anesthesia Plan: General   Post-op Pain Management:    Induction: Intravenous  PONV Risk Score and Plan: 2 and Ondansetron and Treatment may vary due to age or medical condition  Airway Management Planned: Nasal ETT  Additional Equipment:   Intra-op Plan:   Post-operative Plan: Extubation in OR  Informed Consent: I have reviewed the patients History and Physical, chart, labs and discussed the procedure including the risks, benefits and alternatives for the proposed anesthesia with the patient or authorized representative who has indicated his/her understanding and acceptance.     Plan Discussed with: CRNA and Surgeon  Anesthesia Plan Comments:         Anesthesia Quick Evaluation

## 2017-12-18 NOTE — H&P (Signed)
H&P documentation  -History and Physical Reviewed  -Patient has been re-examined  -No change in the plan of care  Judith Fisher  

## 2017-12-18 NOTE — Progress Notes (Signed)
Patient originally stating she could not swallow. Dr. Maple HudsonMoser made aware. Patient able to swallow and took percocet without issue.

## 2017-12-18 NOTE — Anesthesia Postprocedure Evaluation (Signed)
Anesthesia Post Note  Patient: Chriss DriverKrystel M Polsky  Procedure(s) Performed: DENTAL RESTORATION/EXTRACTIONS (N/A Mouth)     Patient location during evaluation: PACU Anesthesia Type: General Level of consciousness: awake and alert Pain management: pain level controlled Vital Signs Assessment: post-procedure vital signs reviewed and stable Respiratory status: spontaneous breathing, nonlabored ventilation, respiratory function stable and patient connected to nasal cannula oxygen Cardiovascular status: blood pressure returned to baseline and stable Postop Assessment: no apparent nausea or vomiting Anesthetic complications: no    Last Vitals:  Vitals:   12/18/17 1314 12/18/17 1315  BP: 105/73 105/73  Pulse: (!) 102 (!) 101  Resp: (!) 30 10  Temp: 36.8 C   SpO2: 100% 99%    Last Pain:  Vitals:   12/18/17 1314  TempSrc:   PainSc: 0-No pain                 Shital Crayton DAVID

## 2017-12-18 NOTE — Progress Notes (Signed)
Per Dr. Michelle Piperssey stated that there is no need for a HCG because patient has had a tubal ligation.  No repeat HCg obtained.

## 2017-12-18 NOTE — Op Note (Signed)
12/18/2017  12:47 PM  PATIENT:  Judith Fisher  27 y.o. female  PRE-OPERATIVE DIAGNOSIS:  NON RESTORABLE TEETH SECONDARY TO DENTAL CARIES # 2, 3, 4, 5, 6, 7, 8, 9, 10, 11, 12, 13, 14, 15, 18, 19, 20, 21, 22, 23, 24, 25, 26, 27, 28, 29, 30, 31, IMPACTED TOOTH #1  POST-OPERATIVE DIAGNOSIS:  SAME  PROCEDURE:  Procedure(s): EXTRACTION TEETH # 1,   2, 3, 4, 5, 6, 7, 8, 9, 10, 11, 12, 13, 14, 15, 18, 19, 20, 21, 22, 23, 24, 25, 26, 27, 28, 29, 30, 31; ALVEOLOPLASTY RIGHT AND LEFT MAXILLA AND MANDIBLE    SURGEON:  Surgeon(s): Ocie DoyneJensen, Kamla Skilton, DDS  ANESTHESIA:   local and general  EBL:  minimal  DRAINS: none   SPECIMEN:  No Specimen  COUNTS:  YES  PLAN OF CARE: Discharge to home after PACU  PATIENT DISPOSITION:  PACU - hemodynamically stable.   PROCEDURE DETAILS: Dictation # [id number]  Judith LopesScott M. Nekeshia Fisher, DMD 12/18/2017 12:47 PM

## 2017-12-18 NOTE — Transfer of Care (Signed)
Immediate Anesthesia Transfer of Care Note  Patient: Judith Fisher  Procedure(s) Performed: DENTAL RESTORATION/EXTRACTIONS (N/A Mouth)  Patient Location: PACU  Anesthesia Type:General  Level of Consciousness: awake, alert , oriented and patient cooperative  Airway & Oxygen Therapy: Patient Spontanous Breathing and Patient connected to nasal cannula oxygen  Post-op Assessment: Report given to RN, Post -op Vital signs reviewed and stable and Patient moving all extremities  Post vital signs: Reviewed and stable  Last Vitals:  Vitals:   12/18/17 0952  BP: 99/65  Pulse: 80  Resp: 18  Temp: 37.1 C  SpO2: 100%    Last Pain:  Vitals:   12/18/17 1009  TempSrc:   PainSc: 3       Patients Stated Pain Goal: 2 (12/18/17 1009)  Complications: No apparent anesthesia complications

## 2017-12-18 NOTE — Progress Notes (Signed)
Patient complaining of chest tightness, Dr Michelle Piperssey made aware

## 2017-12-18 NOTE — Anesthesia Procedure Notes (Signed)
Procedure Name: Intubation Date/Time: 12/18/2017 12:02 PM Performed by: Clearnce Sorrel, CRNA Pre-anesthesia Checklist: Patient identified, Emergency Drugs available, Suction available, Patient being monitored and Timeout performed Patient Re-evaluated:Patient Re-evaluated prior to induction Oxygen Delivery Method: Circle system utilized Preoxygenation: Pre-oxygenation with 100% oxygen Induction Type: IV induction Ventilation: Mask ventilation without difficulty Laryngoscope Size: Mac and 3 Grade View: Grade I Tube type: Oral Nasal Tubes: Nasal Rae Tube size: 6.5 mm Number of attempts: 1 Placement Confirmation: ETT inserted through vocal cords under direct vision,  positive ETCO2 and breath sounds checked- equal and bilateral Tube secured with: Tape Dental Injury: Teeth and Oropharynx as per pre-operative assessment

## 2017-12-19 ENCOUNTER — Emergency Department (HOSPITAL_COMMUNITY)
Admission: EM | Admit: 2017-12-19 | Discharge: 2017-12-20 | Disposition: A | Discharge status: Home / Self Care | Payer: Medicaid Other | Attending: Emergency Medicine | Admitting: Emergency Medicine

## 2017-12-19 ENCOUNTER — Other Ambulatory Visit: Payer: Self-pay

## 2017-12-19 ENCOUNTER — Encounter (HOSPITAL_COMMUNITY): Payer: Self-pay | Admitting: Oral Surgery

## 2017-12-19 DIAGNOSIS — Z79899 Other long term (current) drug therapy: Secondary | ICD-10-CM | POA: Insufficient documentation

## 2017-12-19 DIAGNOSIS — Z98818 Other dental procedure status: Secondary | ICD-10-CM | POA: Insufficient documentation

## 2017-12-19 DIAGNOSIS — F17210 Nicotine dependence, cigarettes, uncomplicated: Secondary | ICD-10-CM | POA: Insufficient documentation

## 2017-12-19 DIAGNOSIS — Z9889 Other specified postprocedural states: Secondary | ICD-10-CM

## 2017-12-19 DIAGNOSIS — R600 Localized edema: Secondary | ICD-10-CM | POA: Insufficient documentation

## 2017-12-19 DIAGNOSIS — R220 Localized swelling, mass and lump, head: Secondary | ICD-10-CM

## 2017-12-19 LAB — BASIC METABOLIC PANEL
ANION GAP: 10 (ref 5–15)
BUN: 6 mg/dL (ref 6–20)
CHLORIDE: 103 mmol/L (ref 101–111)
CO2: 26 mmol/L (ref 22–32)
Calcium: 9 mg/dL (ref 8.9–10.3)
Creatinine, Ser: 0.62 mg/dL (ref 0.44–1.00)
GFR calc Af Amer: >60 mL/min (ref >60)
GLUCOSE: 92 mg/dL (ref 65–99)
POTASSIUM: 3.9 mmol/L (ref 3.5–5.1)
Sodium: 139 mmol/L (ref 135–145)

## 2017-12-19 LAB — CBC WITH DIFFERENTIAL/PLATELET
BASOS ABS: 0 10*3/uL (ref 0.0–0.1)
Basophils Relative: 0 %
EOS PCT: 2 %
Eosinophils Absolute: 0.2 10*3/uL (ref 0.0–0.7)
HEMATOCRIT: 36.7 % (ref 36.0–46.0)
Hemoglobin: 12.1 g/dL (ref 12.0–15.0)
LYMPHS ABS: 2.3 10*3/uL (ref 0.7–4.0)
LYMPHS PCT: 27 %
MCH: 30.4 pg (ref 26.0–34.0)
MCHC: 33 g/dL (ref 30.0–36.0)
MCV: 92.2 fL (ref 78.0–100.0)
MONO ABS: 0.9 10*3/uL (ref 0.1–1.0)
Monocytes Relative: 11 %
NEUTROS ABS: 5.2 10*3/uL (ref 1.7–7.7)
Neutrophils Relative %: 60 %
Platelets: 206 10*3/uL (ref 150–400)
RBC: 3.98 MIL/uL (ref 3.87–5.11)
RDW: 12.8 % (ref 11.5–15.5)
WBC: 8.7 10*3/uL (ref 4.0–10.5)

## 2017-12-19 MED ORDER — SODIUM CHLORIDE 0.9 % IV BOLUS (SEPSIS)
1000.0000 mL | Freq: Once | INTRAVENOUS | Status: AC
  Administered 2017-12-19: 1000 mL via INTRAVENOUS

## 2017-12-19 MED ORDER — DEXAMETHASONE SODIUM PHOSPHATE 10 MG/ML IJ SOLN
10.0000 mg | Freq: Once | INTRAMUSCULAR | Status: AC
  Administered 2017-12-19: 10 mg via INTRAVENOUS
  Filled 2017-12-19: qty 1

## 2017-12-19 NOTE — ED Notes (Signed)
Large amount of swelling noted to mouth and cheeks.   Denies having any issues breathing.  Small amount of bleeding noted from gums.

## 2017-12-19 NOTE — ED Triage Notes (Signed)
Pt had oral surgery - teeth extraction - yesterday.  Onset today Swelling to cheeks and lips, blisters on lips.  Able to swallow saliva and fluids.

## 2017-12-19 NOTE — ED Provider Notes (Signed)
[facility] EMERGENCY DEPARTMENT Provider Note   CSN: [id number] Arrival date & time: 12/19/17  2103     History   Chief Complaint Chief Complaint  Patient presents with  . Oral Swelling    HPI Judith Fisher is a 27 y.o. female who presents today for evaluation of swelling to mouth and cheeks.  She had a dental extraction yesterday where all of her teeth were removed.  She reports that she was given antibiotics and pain medicine, and has been taking the amoxicillin.  She reports that she has previously taken amoxicillin without any adverse effect.  She reports that this is day 3 of taking Chantix, and has not had any issues until today.  She reports that she is still able to swallow, however it is painful and hard to do and therefore prefers to drool.  She reports that her pain is well controlled with her current medicines.  No fevers or chills.  She reports that she has blisters on her lip, however denies any other blisters or rashes. S/p tubal ligation.  HPI  Past Medical History:  Diagnosis Date  . Abnormal Pap smear of cervix    Several years ago  . Seizures (HCC)    as a child, patient was taking medications for it but she stated that "she took her self off those medication"    Patient Active Problem List   Diagnosis Date Noted  . Normal labor 11/08/2016  . Limited prenatal care 11/08/2016  . Supervision of normal pregnancy 06/25/2016  . Request for sterilization 06/25/2016  . Short interval between pregnancies affecting pregnancy, antepartum 06/25/2016    Past Surgical History:  Procedure Laterality Date  . NO PAST SURGERIES    . TOOTH EXTRACTION N/A 12/18/2017   Procedure: DENTAL RESTORATION/EXTRACTIONS;  Surgeon: Ocie Doyne, DDS;  Location: [facility];  Service: Oral Surgery;  Laterality: N/A;  . TUBAL LIGATION Bilateral 11/09/2016   Procedure: POST PARTUM TUBAL LIGATION;  Surgeon: Lesly Dukes, MD;  Location: [facility] ORS;  Service: Gynecology;   Laterality: Bilateral;    OB History    Gravida Para Term Preterm AB Living   3 3 3  0 0 3   SAB TAB Ectopic Multiple Live Births   0 0 0 0 3       Home Medications    Prior to Admission medications   Medication Sig Start Date End Date Taking? Authorizing Provider  amoxicillin (AMOXIL) 500 MG capsule Take 1 capsule (500 mg total) by mouth 3 (three) times daily. 12/18/17  Yes Ocie Doyne, DDS  oxyCODONE-acetaminophen (PERCOCET) 5-325 MG tablet Take 1 tablet by mouth every 6 (six) hours as needed. 12/18/17  Yes Ocie Doyne, DDS  predniSONE 5 MG/5ML solution Please take 60mg  (60ml) by mouth on day one, then 50mg  (50ml) on day two, 40mg  (40ml) on day three, 30mg  (30ml) on day four, 20mg  (20ml) on day five, and 10mg  (10ml) on day six. 12/20/17   Cristina Gong, PA-C    Family History Family History  Problem Relation Age of Onset  . Mental illness Brother   . Cancer Maternal Grandmother     Social History Social History   Tobacco Use  . Smoking status: Current Every Day Smoker    Packs/day: 0.25    Types: Cigarettes  . Smokeless tobacco: Never Used  . Tobacco comment: patient is on Chantix  Substance Use Topics  . Alcohol use: No  . Drug use: No     Allergies  Patient has no known allergies.   Review of Systems Review of Systems  Constitutional: Negative for chills and fever.  HENT: Positive for drooling, facial swelling and trouble swallowing. Negative for ear pain and sore throat.   Eyes: Negative for pain and visual disturbance.  Respiratory: Negative for cough and shortness of breath.   Cardiovascular: Negative for chest pain and palpitations.  Gastrointestinal: Negative for abdominal pain and vomiting.  Genitourinary: Negative for dysuria and hematuria.  Musculoskeletal: Negative for arthralgias and back pain.  Skin: Negative for color change and rash.  Neurological: Negative for seizures and syncope.  All other systems reviewed and are  negative.    Physical Exam Updated Vital Signs BP 104/75   Pulse 86   Temp 98.3 F (36.8 C) (Axillary)   Resp 16   LMP 10/19/2017   SpO2 98%   Physical Exam  Constitutional: She is oriented to person, place, and time. She appears well-developed and well-nourished. No distress.  HENT:  Head: Atraumatic.  Please see attached clinical image.  There is marked swelling to lower face/jaw.  Swelling appears symmetrical.  There is no obvious area of increased fluctuance or induration.  Patient has trismus, unable to adequately view uvula, posterior pharynx.  Patient has swelling on her lower lip, along with blisters to the lower lip.  She is able to swallow.  There is scant blood oozing from dental extraction sites.    Eyes: Conjunctivae are normal.  Neck: Normal range of motion. Neck supple.  Cardiovascular: Normal rate, regular rhythm, normal heart sounds and intact distal pulses.  No murmur heard. Pulmonary/Chest: Effort normal and breath sounds normal. No stridor. No respiratory distress.  Abdominal: Soft. There is no tenderness.  Musculoskeletal: She exhibits no edema.  Neurological: She is alert and oriented to person, place, and time.  Skin: Skin is warm and dry.  Psychiatric: She has a normal mood and affect. Her behavior is normal.  Nursing note and vitals reviewed.          ED Treatments / Results  Labs (all labs ordered are listed, but only abnormal results are displayed) Labs Reviewed  BASIC METABOLIC PANEL  CBC WITH DIFFERENTIAL/PLATELET    EKG  EKG Interpretation None       Radiology No results found.  Procedures Procedures (including critical care time)  Medications Ordered in ED Medications  dexamethasone (DECADRON) injection 10 mg (10 mg Intravenous Given 12/19/17 2242)  sodium chloride 0.9 % bolus 1,000 mL (0 mLs Intravenous Stopped 12/19/17 2333)     Initial Impression / Assessment and Plan / ED Course  I have reviewed the triage vital  signs and the nursing notes.  Pertinent labs & imaging results that were available during my care of the patient were reviewed by me and considered in my medical decision making (see chart for details).  Clinical Course as of Dec 21 555  Sat Dec 19, 2017  2213 Notified that Dr. Barbette MerinoJensen is no longer on call.  Will still attempt to contact as she is his patient.   [EH]  2230 Dr. Barbette MerinoJensen reviewed clinical images, says that he does not feel a CT scan is needed.  Basic labs drawn.  She will be given IV fluids and increased PO fluids.  Decadron recommended and discharge with medrol dose pack.   [EH]    Clinical Course User Index [EH] Cristina GongHammond, Shawanna Zanders W, PA-C    Chriss DriverKrystel M Fisher presents today for evaluation of facial swelling after she had full dental extractions  yesterday.  She is afebrile, hemodynamically stable, tolerating own secretions.  She is able to drink with out difficulty.  Dr. Barbette Merino who performed her extractions was on call and viewed the clinical images I placed in her chart of her swelling and he feels like this is a normal level of swelling given the procedure and is not concerned for infection based on symmetrical nature of swelling.  She was given decadron IV to help reduce swelling along with fluid bolus.  She does have sores on her lower lip, however does not have any rash or blisters on other areas or mucus membranes.  Possibility of allergic reaction was considered, however she has taken amoxicillin and these pain medicines in the past and with out difficulty.  She was given a rx for a medrol dose pack, liquid form per her preference.  She was given strict return precautions and stated her understanding. Ice instructions.  Follow up with Dr. Barbette Merino as recommended.    Final Clinical Impressions(s) / ED Diagnoses   Final diagnoses:  Facial swelling  Post-operative state    ED Discharge Orders        Ordered    predniSONE (STERAPRED UNI-PAK 21 TAB) 10 MG (21) TBPK tablet   Daily,   Status:  Discontinued     12/20/17 0028    predniSONE 5 MG/5ML solution     12/20/17 0033       Cristina Gong, PA-C 12/20/17 1610    Benjiman Core, MD 12/21/17 [phone]

## 2017-12-20 MED ORDER — PREDNISONE 10 MG (21) PO TBPK: ORAL_TABLET | Freq: Every day | ORAL | 0 refills | Status: DC

## 2017-12-20 MED ORDER — PREDNISONE 5 MG/5ML PO SOLN: ORAL | 0 refills | Status: AC

## 2017-12-20 NOTE — Discharge Instructions (Signed)
If you worsen or have any concerns please do not hesitate to return to the emergency room for repeat evaluation, especially if you develop shortness of breath, worsening swelling, are unable to swallow or feel like your tongue is swelling, or develop blisters or rashes any where else.  You were given steroids in the emergency room and a prescription for steroids at home.  Please be aware that steroids can make you feel hot, want to eat more, give you more energy/make you feel hyper.  Please check your temperature to monitor for fevers.

## 2017-12-21 NOTE — Op Note (Signed)
NAME:  Judith Fisher, Judith             ACCOUNT NO.:  [id number]  MEDICAL RECORD NO.:  [id number]  LOCATION:                                 FACILITY:  PHYSICIAN:  Georgia LopesScott M. Amadu Schlageter, M.D.       DATE OF BIRTH:  DATE OF PROCEDURE:  12/18/2017 DATE OF DISCHARGE:                              OPERATIVE REPORT   PREOPERATIVE DIAGNOSES:  Nonrestorable teeth secondary to severe dental caries numbers 2, 3, 4, 5, 6, 7, 8, 9, 10, 11, 12, 13, 14, 15, 18, 19, 20, 21, 22, 23, 24, 25, 26, 27, 28, 29, 30; bony impacted tooth #1.  POSTOPERATIVE DIAGNOSES:  Nonrestorable teeth secondary to severe dental caries numbers 2, 3, 4, 5, 6, 7, 8, 9, 10, 11, 12, 13, 14, 15, 18, 19, 20, 21, 22, 23, 24, 25, 26, 27, 28, 29, 30; bony impacted tooth #1.  PROCEDURES:  Extraction of tooth numbers 1, 2, 3, 4, 5, 6, 7, 8, 9, 10, 11, 12, 13, 14, 15, 18, 19, 20, 21, 22, 23, 24, 25, 26, 27, 28, 29, 30, 31; alveoplasty of right and left maxilla and mandible.  SURGEON:  Georgia LopesScott M. Malvin Morrish, MD.  ANESTHESIA:  General, nasal intubation, Dr. Michelle Piperssey attending.  DESCRIPTION OF PROCEDURE:  The patient was taken to the operating room and placed on the table in supine position.  General anesthesia was administered intravenously and a nasal endotracheal tube was placed and secured.  The eyes were protected and the patient was draped for surgery.  Time-out was performed and the posterior pharynx was suctioned.  A throat pack was placed.  A 2% lidocaine with 1:100,000 epinephrine was infiltrated in an inferior alveolar block on the right and left side and buccal and palatal infiltration in the maxilla around the teeth to be removed.  A total of 20 mL was utilized.  A bite block was placed on the right side of the mouth and a sweetheart retractor was used to retract the tongue.  A #15 blade was used to make a sulcular incision beginning at tooth #18, carrying forward across the midline to tooth #26 in both the buccal and lingual sulcus.   The 15 blade was then used to make an incision beginning at tooth #15 and carried forward across the midline to tooth #7 both buccally and palatally in the gingival sulcus.  The periosteum was reflected from around these teeth. The teeth were elevated with a 301 elevator and removed from the mouth with a dental forceps in a simple fashion.  The sockets were curetted. The periosteum was reflected to expose the alveolar crest in the left maxilla and mandible and then an alveoplasty was performed using the egg- shaped bur and bone file.  Then, the area was irrigated and closed with 3-0 chromic.  The sweetheart retractor and bite block were repositioned to the other side of the mouth and a 15 blade was used to make an incision overlying tooth #1 and carrying forward both buccally and palatally in the gingival sulcus around teeth 2, 3, 4, 5, and 6 in the maxilla and then an incision was made around teeth numbers 27, 28, 29, 30, 31 in the mandible and  the buccal and lingual gingival sulcus.  The periosteum was reflected.  The teeth were exposed.  The bone was removed overlying tooth #1 and then the tooth was elevated with a 301 elevator and removed.  Teeth 2, 3, 4, 5, and 6 were elevated as well as the remaining teeth in the right mandible.  Then, all the teeth were removed using the dental forceps in a simple fashion.  The sockets were curetted.  The periosteum was reflected to expose the alveolar crest and then alveoplasty was performed using an egg-shaped bur and bone file. The areas were irrigated and closed with 3-0 chromic.  Then, the oral cavity was inspected and found to have good contour and adequate hemostasis and closure.  The oral cavity was irrigated and suctioned. Throat pack was removed.  The patient was left in the care of anesthesia for extubation and transportation to the recovery room with plans for discharge home through Day Surgery.  ESTIMATED BLOOD LOSS:   Minimal.  COMPLICATIONS:  None.  SPECIMENS:  None.     Georgia Lopes, M.D.     SMJ/MEDQ  D:  12/18/2017  T:  12/19/2017  Job:  [id number]

## 2020-04-09 ENCOUNTER — Inpatient Hospital Stay: Admit: 2020-04-09 | Attending: Obstetrics & Gynecology | Primary: Family Medicine

## 2020-04-09 ENCOUNTER — Ambulatory Visit: Admit: 2020-04-09 | Primary: Family Medicine

## 2021-06-20 ENCOUNTER — Inpatient Hospital Stay
Admit: 2021-06-20 | Discharge: 2021-06-20 | Disposition: A | Discharge status: Home / Self Care | Attending: Emergency Medicine

## 2021-06-20 DIAGNOSIS — L509 Urticaria, unspecified: Secondary | ICD-10-CM

## 2021-06-20 MED ORDER — DIPHENHYDRAMINE HCL 50 MG/ML IJ SOLN
50 MG/ML | Freq: Once | INTRAMUSCULAR | Status: AC
Start: 2021-06-20 — End: 2021-06-20
  Administered 2021-06-20: 21:00:00 25 mg via INTRAMUSCULAR

## 2021-06-20 MED ORDER — FAMOTIDINE 20 MG PO TABS
20 MG | Freq: Once | ORAL | Status: AC
Start: 2021-06-20 — End: 2021-06-20
  Administered 2021-06-20: 21:00:00 20 mg via ORAL

## 2021-06-20 MED FILL — DIPHENHYDRAMINE HCL 50 MG/ML IJ SOLN: 50 MG/ML | INTRAMUSCULAR | Qty: 1

## 2021-06-20 MED FILL — FAMOTIDINE 20 MG PO TABS: 20 MG | ORAL | Qty: 1

## 2021-06-20 NOTE — Other (Unsigned)
Patient Acct Nbr: [id number]   Primary AUTH/CERT:   Primary Insurance Company Name: [employer]  Primary Insurance Plan name: Rosann Auerbach  Primary Insurance Group Number: [id number]  Primary Insurance Plan Type: Health  Primary Insurance Policy Number: [id number]

## 2021-06-20 NOTE — Discharge Instructions (Signed)
Continue the tapering prednisone as prescribed by the urgent care.    Use Benadryl over-the-counter 25 mg every 4-6 hours as needed for itching.  Also use Pepcid 20 mg twice a day for the next 7 days.  This is also over-the-counter.

## 2021-06-20 NOTE — ED Provider Notes (Signed)
[facility] EMERGENCY DEPT  EMERGENCY DEPARTMENT ENCOUNTER      Pt Name: Rhonda Hobbs  MRN: [id number]  Birthdate 06-05-1991  Date of evaluation: 06/20/2021  Provider: Laverle Hobby, MD    I, Lorna Dibble. Katrinka Blazing, MD, am the primary clinician of record.    CHIEF COMPLAINT       Chief Complaint   Patient presents with   ??? Rash     itchy rash all over x 2 weeks; reports started prednisone 2 days ago that was prescribed by a [facility]; reports no relief           HISTORY OF PRESENT ILLNESS   (Location/Symptom, Timing/Onset,Context/Setting, Quality, Duration, Modifying Factors, Severity)  Note limiting factors.   ALAISHA Hobbs is a 30 y.o. female who presents to the emergency department with rash is pruritic for the past 2 weeks.  Started on prednisone 2 days ago.  It is a tapering dose 60 mg 50 40,30 20,10 and good taper.  She has been on it for 2 days.  She is not been taking Benadryl or Pepcid.  No new soaps close detergents.  No new exposures.  No one else at home has a rash.  Never had it before.  Comes in to be seen because she feels like is not getting better.  She will be spreading more better in her legs but now she has on her chest up into her neck.  No difficulty breathing or swallowing.    HPI    Nurse's notes for past medical history, surgical history, social history were reviewed.  Medications and allergies reviewed.    REVIEW OF SYSTEMS    (2-9 systems for level 4, 10 or more for level 5)     Review of Systems    Total of 10 systems reviewed, please see pertinent positives, pertinent negatives above in HPI.      PAST MEDICAL HISTORY   History reviewed. No pertinent past medical history.      SURGICALHISTORY     History reviewed. No pertinent surgical history.      CURRENT MEDICATIONS       Previous Medications    BUSPIRONE (BUSPAR) 5 MG TABLET    take 2 tablets by mouth every morning and 1 tablet by mouth EVERY AFTERNOON AND 2 IN THE EVENING    PREDNISONE (DELTASONE) 10 MG TABLET    Take by mouth             Azithromycin and Gluten    FAMILY HISTORY     History reviewed. No pertinent family history.       SOCIAL HISTORY       Social History     Socioeconomic History   ??? Marital status: Married     Spouse name: None   ??? Number of children: None   ??? Years of education: None   ??? Highest education level: None   Occupational History   ??? None   Tobacco Use   ??? Smoking status: Never Smoker   ??? Smokeless tobacco: Never Used   Substance and Sexual Activity   ??? Alcohol use: Not Currently   ??? Drug use: Never   ??? Sexual activity: None   Other Topics Concern   ??? None   Social History Narrative   ??? None     Social Determinants of Health     Financial Resource Strain:    ??? Difficulty of Paying Living Expenses: Not on file   Food Insecurity:    ???  Worried About Programme researcher, broadcasting/film/video in the Last Year: Not on file   ??? Ran Out of Food in the Last Year: Not on file   Transportation Needs:    ??? Lack of Transportation (Medical): Not on file   ??? Lack of Transportation (Non-Medical): Not on file   Physical Activity:    ??? Days of Exercise per Week: Not on file   ??? Minutes of Exercise per Session: Not on file   Stress:    ??? Feeling of Stress : Not on file   Social Connections:    ??? Frequency of Communication with Friends and Family: Not on file   ??? Frequency of Social Gatherings with Friends and Family: Not on file   ??? Attends Religious Services: Not on file   ??? Active Member of Clubs or Organizations: Not on file   ??? Attends Banker Meetings: Not on file   ??? Marital Status: Not on file   Intimate Partner Violence:    ??? Fear of Current or Ex-Partner: Not on file   ??? Emotionally Abused: Not on file   ??? Physically Abused: Not on file   ??? Sexually Abused: Not on file   Housing Stability:    ??? Unable to Pay for Housing in the Last Year: Not on file   ??? Number of Places Lived in the Last Year: Not on file   ??? Unstable Housing in the Last Year: Not on file       SCREENINGS    Glasgow Coma Scale  Eye Opening: Spontaneous  Best  Verbal Response: Oriented  Best Motor Response: Obeys commands  Glasgow Coma Scale Score: 15        PHYSICAL EXAM    (up to 7 for level 4, 8 or more for level 5)     ED Triage Vitals [06/20/21 1618]   BP Temp Temp Source Heart Rate Resp SpO2 Height Weight   (!) 142/93 98.2 ??F (36.8 ??C) Oral 93 16 100 % 5\' 6"  (1.676 m) 140 lb (63.5 kg)       Appropriate PPE including n 95, gown, gloves, goggles where worn when appropriate with this patient.    Physical Exam  Vital signs reviewed  general: Alert and oriented ??3  head: Atraumatic  eyes: Equal round reactive to light and accommodating, pupils are equal, round and reactive to light and accommodation  oropharynx: Clear and well hydrated, no stridor neck: Supple   heart: Regular rate and rhythm, no murmurs  lungs: Clear to auscultation bilaterally.  No wheezing  Extremities: Moving all fours, no tenderness.  Normal capillary refill.  Skin: Urticaria throughout the chest into the neck.  neurologically: Alert and oriented ??3,  DIAGNOSTIC RESULTS       No orders to display         ED BEDSIDE ULTRASOUND:   Performed by ED Physician - none    LABS:  Labs Reviewed - No data to display    All other labs were within normal range or not returned as of thisdictation.    EMERGENCYDEPARTMENT COURSE and DIFFERENTIAL DIAGNOSIS/MDM:   Vitals:    Vitals:    06/20/21 1618   BP: (!) 142/93   Pulse: 93   Resp: 16   Temp: 98.2 ??F (36.8 ??C)   TempSrc: Oral   SpO2: 100%   Weight: 63.5 kg (140 lb)   Height: 5\' 6"  (1.676 m)       ED Course -continue the prednisone.  We  will give her Benadryl.  Use Benadryl Pepcid.  Refer to dermatology.  She is nontoxic.  No signs of cellulitis.  No signs of bacterial infection.  Return here if any problems or concerns.    Patients symptoms are consistent with sepsis, severe sepsis, or septic shock (If yes use ".sepsiscoremeasure"):no    MDM    CRITICAL CARE TIME   Total Critical Care time was 0 minutes, excluding separately reportable procedures.  There was a  high probability of clinically significant/life threatening deterioration in the patient's condition which required my urgentintervention.     CONSULTS:  None    PROCEDURES:  Unless otherwise noted below, none     Procedures    IMPRESSION      1. Urticaria    2. Dermatitis          DISPOSITION/PLAN   DISPOSITION Decision To Discharge 06/20/2021 04:40:12 PM      PATIENT REFERRED TO:  [facility] dermatology    In 1 week  For follow-up care    Dermatology [facility]  [address]  [place] [address]  [phone]          DISCHARGE MEDICATIONS:  New Prescriptions    No medications on file          (Comment: Please notethis report has been produced using speech recognition software and may contain errors related to that system including errors in grammar, punctuation, and spelling, as well as words and phrases that may be inappropriate.If there is any questions or concerns please feel free to contact the dictating provider for clarification).    Laverle Hobby, MD (electronically signed)  Attending Emergency Physician         Laverle Hobby, MD  06/20/21 [phone]

## 2021-06-21 NOTE — Telephone Encounter (Signed)
What is the reason for your appointment request: hives for over a week- hives going up neck and face- became swollen and went to er    Is there a spot or area on your skin that is concerning you?: all over body    If reason for visit is a rash is it painful, itching to the point of keeping you awake at night?: yes- itching    If reason for visit is a "spot, lesion, mole" is it changing rapidly, bleeding or is it "black",or painful? : n/a    Were you referred by another provider or PCP? Referred  by ED    Have you ever been seen for this problem or concern? : no    If Yes were you seen by one of our providers?: no    (Please note a full body skin exam is a specialized exam and cannot be done on the first visit, you can be seen for an acute concern or issue and be scheduled for a full body exam after seeing the provider)

## 2021-06-24 NOTE — Telephone Encounter (Signed)
Patient scheduled on 06/26/2021

## 2021-06-24 NOTE — Telephone Encounter (Signed)
PATIENT CALLED TO SCHEDULE.

## 2021-06-26 ENCOUNTER — Ambulatory Visit
Admit: 2021-06-26 | Discharge: 2021-06-26 | Payer: PRIVATE HEALTH INSURANCE | Attending: Physician Assistant | Primary: Family Medicine

## 2021-06-26 DIAGNOSIS — L509 Urticaria, unspecified: Secondary | ICD-10-CM

## 2021-06-26 NOTE — Progress Notes (Signed)
[facility]  [address]  [place] [address]  Dept: [phone]  Dept Fax: [phone]  Loc: [phone]       DATE OF SERVICE: 06/26/2021  PATIENT NAME:  Rhonda Hobbs  DOB:  08/29/1991  AGE:  30 y.o.  CLINIC NUMBER:  [id number]    Visit type: New patient    Chief Complaint   Patient presents with    Rash     NP (SMB)       Subjective   HISTORY OF PRESENT ILLNESS:  This is a 30 y.o. female who presents for evaluation of rash/hives located on the full body x 2-3 weeks ago.   Denies any new personal products.   Admits any new medications/supplements or changes in medications/supplements size, shape, color - started a new multivitamin within the last 1-2 months  Denies any recent illness/fever.   Denies anyone else in the same house with similar rash.    Admits previous treatment - prednisone taper (has been taking for 1 week and hives resolved)       Patient has never seen a dermatologist in the past.      Pt states initially when this started she thought she was getting spider bites but this continue to spread and get worse.     Are you pregnant, trying to become pregnant or breastfeeding? No  History of pacemaker/ defibrillator? No  History of HIV/ Hep C? No  Allergies to Lidocaine, Epinephrine, Latex or Adhesive? No    Social History:     Social History     Tobacco Use    Smoking status: Never    Smokeless tobacco: Never   Substance Use Topics    Alcohol use: Not Currently       History reviewed. No pertinent past medical history.    History reviewed. No pertinent surgical history.    History reviewed. No pertinent family history.    Current Outpatient Medications   Medication Sig Dispense Refill    busPIRone (BUSPAR) 5 MG tablet take 2 tablets by mouth every morning and 1 tablet by mouth EVERY AFTERNOON AND 2 IN THE EVENING      predniSONE (DELTASONE) 10 MG tablet Take by mouth       No current facility-administered medications for this visit.       Allergies    Allergen Reactions    Azithromycin     Gluten          REVIEW OF SYSTEMS:   General/Constitutional   Feels well; denies h/o fatigue, weight change, night sweats, fevers or chills.  Lymphatic  Denies swollen lymph nodes.  Dermatologic  As per HPI; denies new rashes, itching, hair loss, skin or nail changes.     Objective   There were no vitals filed for this visit.    PHYSICAL EXAM:   GENERAL APPEARANCE: Alert & oriented x3, pleasant. Well developed, well nourished. Presents today alone.  PSYCH: appropriate mood and affect      DERMATOLOGY:  Skin Type: I  General: widespread freckling, not flares on her skin today  Digital photos on her phone show large erythematous urticarial wheals of the entire left flank, abdomen and mid chest, smaller wheals of the arms.    Assessment/Plan   1. Urticaria   [] Chronic [x] Acute  [] Stable [x] Flaring/Exacerbation  Educated and reassured. Treatment options, risks, benefits, and expectations reviewed. She states that this firsted started on her lower leg where she got a  bunch of small spider bites, then large hives started appearing on her chest, abdomen.  Viewed digital pictures on her phone.  She had been normally taking Xyzal 5mg  daily but ran out several months ago.  Advised to restart antihistamine and start now while finishing her prednisone. Oral prednisone is not a recommended long term treatment for hives. If this persists we can consider referral to allergist. She is already using fragrance free and dye free products.  She has had "allergies" and skin issues ever since she was a baby.      Start:    - Daily Antihistamine(zyrtec/allegra/claritin)     Continue:    - Prednisone taper until gone    No orders of the defined types were placed in this encounter.    No orders of the defined types were placed in this encounter.    FOLLOW UP: Return if symptoms worsen or fail to improve.        , PA-C  06/25/21  2:11 PM EDT     REFERRING MD: 06/27/21, MD  [address]  [place],  [place]

## 2021-08-13 ENCOUNTER — Inpatient Hospital Stay
Admit: 2021-08-13 | Discharge: 2021-08-13 | Disposition: A | Discharge status: Home / Self Care | Attending: Emergency Medicine

## 2021-08-13 DIAGNOSIS — R1084 Generalized abdominal pain: Secondary | ICD-10-CM

## 2021-08-13 LAB — BASIC METABOLIC PANEL
Anion Gap: 9 mmol/L (ref 3–13)
BUN: 13 mg/dL (ref 9–20)
CO2: 24 mmol/L (ref 22–30)
Calcium: 9.5 mg/dL (ref 8.4–10.4)
Chloride: 104 mmol/L (ref 98–107)
Creatinine: 0.76 mg/dL (ref 0.52–1.25)
Glucose: 88 mg/dL (ref 70–100)
Potassium: 3.5 mmol/L (ref 3.5–5.1)
Sodium: 137 mmol/L (ref 135–145)
eGFR AA: >90 mL/min (ref >60)
eGFR NON-AA: >90 mL/min (ref >60)

## 2021-08-13 LAB — URINALYSIS
Bilirubin Urine: NEGATIVE mg/dL
Glucose, Ur: NORMAL mg/dL (ref <70)
Ketones, Urine: NEGATIVE mg/dL
LEUKOCYTES, UA: NEGATIVE Leu/uL
Nitrite, Urine: NEGATIVE NA
Occult Blood,Urine: NEGATIVE mg/dL
Specific Gravity, Urine: 1.009 NA (ref 1.005–1.030)
Total Protein, Urine: NEGATIVE mg/dL
Urobilinogen, Urine: NORMAL mg/dL (ref 0–1)
pH, Urine: 6.5 NA (ref 5.0–8.0)

## 2021-08-13 LAB — CBC
Hematocrit: 44.7 % (ref 35.0–47.0)
Hemoglobin: 15 g/dL (ref 11.7–16.0)
MCH: 28.2 pg (ref 26.0–34.0)
MCHC: 33.5 % (ref 32.0–36.0)
MCV: 84.3 fL (ref 79.0–98.0)
MPV: 8.2 fL (ref 7.4–12.4)
Platelets: 268 10*3/uL (ref 140–440)
RBC: 5.3 10*6/uL — ABNORMAL HIGH (ref 3.80–5.20)
RDW: 13.8 % (ref 11.5–14.5)
WBC: 7.6 10*3/uL (ref 3.6–10.7)

## 2021-08-13 LAB — HEPATIC FUNCTION PANEL
ALT: 19 U/L (ref 0–34)
AST: 26 U/L (ref 15–46)
Albumin,Serum: 4.6 g/dL (ref 3.5–5.0)
Alkaline Phosphatase: 53 U/L (ref 38–126)
Bilirubin, Direct: 0 mg/dL (ref 0.0–0.3)
Total Bilirubin: 0.6 mg/dL (ref 0.2–1.3)
Total Protein: 7.9 g/dL (ref 6.3–8.2)

## 2021-08-13 LAB — LIPASE: Lipase: 59 U/L (ref 23–300)

## 2021-08-13 LAB — PREGNANCY, URINE: Pregnancy, Urine: NEGATIVE NA

## 2021-08-13 MED ORDER — SODIUM CHLORIDE 0.9 % IV BOLUS
0.9 % | Freq: Once | INTRAVENOUS | Status: AC
Start: 2021-08-13 — End: 2021-08-13
  Administered 2021-08-13: 07:00:00 1000 mL via INTRAVENOUS

## 2021-08-13 MED ORDER — ONDANSETRON HCL 4 MG PO TABS
4 MG | ORAL_TABLET | Freq: Three times a day (TID) | ORAL | 0 refills | Status: AC | PRN
Start: 2021-08-13 — End: ?

## 2021-08-13 MED ORDER — NORMAL SALINE FLUSH 0.9 % IV SOLN
0.9 % | Freq: Three times a day (TID) | INTRAVENOUS | Status: DC
Start: 2021-08-13 — End: 2021-08-13

## 2021-08-13 MED ORDER — ONDANSETRON HCL 4 MG/2ML IJ SOLN
4 MG/2ML | Freq: Once | INTRAMUSCULAR | Status: AC
Start: 2021-08-13 — End: 2021-08-13
  Administered 2021-08-13: 07:00:00 4 mg via INTRAVENOUS

## 2021-08-13 MED ORDER — KETOROLAC TROMETHAMINE 30 MG/ML IJ SOLN
30 MG/ML | Freq: Once | INTRAMUSCULAR | Status: DC
Start: 2021-08-13 — End: 2021-08-13

## 2021-08-13 MED ORDER — DICYCLOMINE HCL 10 MG/ML IM SOLN
10 MG/ML | Freq: Once | INTRAMUSCULAR | Status: AC
Start: 2021-08-13 — End: 2021-08-13
  Administered 2021-08-13: 10:00:00 20 mg via INTRAMUSCULAR

## 2021-08-13 MED ORDER — DICYCLOMINE HCL 10 MG PO CAPS
10 MG | ORAL_CAPSULE | Freq: Four times a day (QID) | ORAL | 0 refills | Status: AC
Start: 2021-08-13 — End: ?

## 2021-08-13 MED ORDER — KETOROLAC TROMETHAMINE 30 MG/ML IJ SOLN
30 MG/ML | Freq: Once | INTRAMUSCULAR | Status: AC
Start: 2021-08-13 — End: 2021-08-13
  Administered 2021-08-13: 10:00:00 15 mg via INTRAVENOUS

## 2021-08-13 MED FILL — ONDANSETRON HCL 4 MG/2ML IJ SOLN: 4 MG/2ML | INTRAMUSCULAR | Qty: 2

## 2021-08-13 MED FILL — DICYCLOMINE HCL 10 MG/ML IM SOLN: 10 MG/ML | INTRAMUSCULAR | Qty: 2

## 2021-08-13 MED FILL — KETOROLAC TROMETHAMINE 30 MG/ML IJ SOLN: 30 MG/ML | INTRAMUSCULAR | Qty: 1

## 2021-08-13 NOTE — ED Notes (Signed)
Pt to CT.     Ollen Bowl, RN  08/13/21 [phone]

## 2021-08-13 NOTE — ED Provider Notes (Signed)
Emergency Department Encounter  [facility] ED    Patient: Rhonda Hobbs  MRN: [id number]  DOB: 10-08-1991  Date of Evaluation: 08/13/2021  ED Provider: Celine Ahr Jalan Bodi, MD    Chief Complaint       Chief Complaint   Patient presents with    Abdominal Cramping     HOPI     I wore a N95 mask, gloves, and goggles for the entirety of this encounter.  Rhonda Hobbs is a 30 y.o. female who presents to the emergency department for evaluation of abdominal pain starting yesterday morning.  Patient states that she had lunch late but has not had dinner.  Has nausea no vomiting.  Patient had a bowel movement and there was blood mixed in with regular stool that was bright red.  No melena.  Denies any vaginal bleeding itching discharge.  Denies dysuria frequency urgency or hematuria.  Denies any known history of Crohn's or colitis.  Patient has had hemorrhoids before and does have some rectal pain.  Patient has never had the abdominal cramps before.    ROS:     At least 10 systems reviewed and otherwise acutely negative except as in the [place].    Past History   History reviewed. No pertinent past medical history.  History reviewed. No pertinent surgical history.  Social History     Socioeconomic History    Marital status: Married     Spouse name: None    Number of children: None    Years of education: None    Highest education level: None   Tobacco Use    Smoking status: Never    Smokeless tobacco: Never   Substance and Sexual Activity    Alcohol use: Not Currently    Drug use: Never     History reviewed. No pertinent family history.    Medications/Allergies     Previous Medications    BUSPIRONE (BUSPAR) 5 MG TABLET    take 2 tablets by mouth every morning and 1 tablet by mouth EVERY AFTERNOON AND 2 IN THE EVENING    PREDNISONE (DELTASONE) 5 MG TABLET    Take 5 mg by mouth daily     Allergies   Allergen Reactions    Azithromycin     Gluten         Physical Exam       ED Triage Vitals [08/13/21 0229]   BP Temp Temp Source Heart  Rate Resp SpO2 Height Weight   (!) 141/93 98.3 ??F (36.8 ??C) Temporal 78 18 98 % -- --       General appearance: Well-appearing, no acute distress.   Psych: Awake alert and oriented ??3. Pleasant and cooperative. Normal mood  Skin: Warm and dry.  Neck: Supple.  Cardiovascular: RRR, normal S1-S2 no murmurs rubs or gallops.  Lungs: CTAB, no accessory muscle use, tachypnea, or retractions.  Abdomen: Soft, tender to palpation epigastric and across the lower abdomen, and nondistended, no rebound, rigidity, or guarding, positive bowel sounds.  Extremities:  Warm and well perfused.  NROM and SILT throughout upper and lower extermities.    Screenings       Glasgow Coma Scale  Eye Opening: Spontaneous  Best Verbal Response: Oriented  Best Motor Response: Obeys commands  Glasgow Coma Scale Score: 15      Diagnostics   Labs:  Results for orders placed or performed during the hospital encounter of 95/63/87   Basic Metabolic Panel   Result Value Ref Range  Sodium 137 135 - 145 mmol/L    Potassium 3.5 3.5 - 5.1 mmol/L    Chloride 104 98 - 107 mmol/L    CO2 24 22 - 30 mmol/L    Anion Gap 9 3 - 13 mmol/L    Glucose 88 70 - 100 mg/dL    BUN 13 9 - 20 mg/dL    Creatinine 0.76 0.52 - 1.25 mg/dL    eGFR African American >90.0 >60 mL/min    EGFR IF NonAfrican American >90.0 >60 mL/min    Calcium 9.5 8.4 - 10.4 mg/dL   HGC Urine Qual Preg   Result Value Ref Range    HCG Urine Negative Negative NA   Hemogram (CBC)   Result Value Ref Range    WBC 7.6 3.6 - 10.7 10*3/uL    RBC 5.30 (H) 3.80 - 5.20 10*6/uL    Hemoglobin 15.0 11.7 - 16.0 g/dL    Hematocrit 44.7 35.0 - 47.0 %    MCV 84.3 79.0 - 98.0 fL    MCH 28.2 26.0 - 34.0 pg    MCHC 33.5 32.0 - 36.0 %    RDW 13.8 11.5 - 14.5 %    Platelets 268 140 - 440 10*3/uL    MPV 8.2 7.4 - 12.4 fL   Lipase   Result Value Ref Range    Lipase 59 23 - 300 U/L   Hepatic Function Panel   Result Value Ref Range    Albumin,Serum 4.6 3.5 - 5.0 g/dL    Total Protein 7.9 6.3 - 8.2 g/dL    Total Bilirubin 0.6  0.2 - 1.3 mg/dL    Bilirubin, Direct 0.0 0.0 - 0.3 mg/dL    Alkaline Phosphatase 53 38 - 126 U/L    ALT 19 0 - 34 U/L    AST 26 15 - 46 U/L   Urinalysis   Result Value Ref Range    Glucose, Ur Normal Normal (<70) mg/dL    Total Protein, Urine Negative Negative mg/dL    Bilirubin Urine Negative Negative mg/dL    Urobilinogen, Urine Normal Normal (0-1) mg/dL    pH, Urine 6.5 5.0 - 8.0 NA    Specific Gravity, Urine 1.009 1.005 - 1.030 NA    Occult Blood,Urine Negative Negative mg/dL    Ketones, Urine Negative Negative mg/dL    Nitrite, Urine Negative Negative NA    LEUKOCYTES, UA Negative Negative Leu/uL    Appearance Clear Clear NA    Color, Urine Light-Yellow Lt. Yellow NA     Radiographs:  CT Abdomen Pelvis Wo Contrast    Result Date: 08/13/2021  Patient Name:                Rhonda Hobbs, Rhonda Hobbs MRN:                         [id number] Acct#:                       [id number] Computed Tomography ACCESSION                 EXAM DATE/TIME           PROCEDURE                 ORDERING PROVIDER [phone]             08/13/2021 04:59 EDT       CT Abdomen/Pelvis (No     Audie Stayer, MD, Elianie Hubers  D PO, No IV) CPT code [phone] Reason For Exam (CT Abdomen/Pelvis (No PO, No IV)) adominal cramps, blood in stool Report CT abdomen and pelvis without contrast HISTORY: Cramping Protocol: 3 mm axial images without oral or intravenous contrast Liver, spleen, pancreas, gallbladder, adrenals, and kidneys are normal. No evidence of bowel inflammation or bowel obstruction. No free fluid. The bladder is unremarkable. IMPRESSION: Normal examination. Report Dictated on Workstation: [id number] ---** Final ---** Dictating Physician: MODY, MD, MALAY Signed Date and Time: 08/13/2021 5:14 am Signed by: MODY, MD, MALAY Transcribed Date and Time: 08/13/2021 5:15     Procedures  None    ED BEDSIDE ULTRASOUND:   Performed by ED Physician - none    EKG:       ED Course and MDM   In brief, Rhonda Hobbs is a 30 y.o. female who presented to the emergency  department for evaluation of abdominal cramping pain with blood in the stool.  Patient given IV fluids and Zofran.  Patient declined anything for pain.  Patient evaluated with blood work urinalysis CT scan of the abdomen pelvis.    After completion of work-up patient reevaluation stating that she still having intermittent cramping pain no further bloody bowel movements and nausea is controlled    Patient is improved but still having some mild lower abdominal pain on exam.  I offered to do a ultrasound.  I have low suspicion for torsion, tubo-ovarian abscess, and the patient's pregnancy test was negative.  Patient declined the ultrasound.    I Dr. Domenic Polite am the primary clinician of record.    ED Medication Orders (From admission, onward)      Start Ordered     Status Ordering Provider    08/13/21 0551 08/13/21 0550  ketorolac (TORADOL) injection 15 mg  ONCE         Last MAR action: Given - by [facility], MATTHEW on 08/13/21 at [place], Quentyn Kolbeck D    08/13/21 0544 08/13/21 0543  dicyclomine (BENTYL) injection 20 mg  ONCE         Last MAR action: Given - by [facility], MATTHEW on 08/13/21 at [place], Shima Compere D    08/13/21 0304 08/13/21 0303  sodium chloride flush 0.9 % injection 3 mL  EVERY 8 HOURS        See Hyperspace for full Linked Orders Report.    Acknowledged Ashawna Hanback D    08/13/21 0304 08/13/21 0303  ondansetron (ZOFRAN) injection 4 mg  ONCE         Last MAR action: Given - by [facility], MATTHEW on 08/13/21 at [place], Kymiah Araiza D    08/13/21 0304 08/13/21 0303  0.9 % sodium chloride bolus  ONCE         Last MAR action: Stopped - by [facility], MATTHEW on 08/13/21 at [place], Fabrizio Filip D            Final Impression      1. Generalized abdominal pain    2. Rectal bleeding      DISPOSITION Decision To Discharge 08/13/2021 07:01:47 AM     (Please note that portions of this note may have been completed with a voice recognition program. Efforts were made to edit the dictations but occasionally words are  mis-transcribed.)    [place], MD  [employer]     Fowler Antos D Nehal Witting, MD  08/13/21 [phone]

## 2021-08-13 NOTE — ED Triage Notes (Signed)
Patient presents to ED c/o abdominal cramping and blood in stool.

## 2021-08-13 NOTE — Other (Unsigned)
Patient Acct Nbr: [id number]   Primary AUTH/CERT:   Primary Insurance Company Name: [employer]  Primary Insurance Plan name: Rosann Auerbach  Primary Insurance Group Number: [id number]  Primary Insurance Plan Type: Health  Primary Insurance Policy Number: [id number]
